# Patient Record
Sex: Female | Born: 1975 | Race: Asian | Hispanic: No | Marital: Married | Smoking: Never smoker
Health system: Southern US, Community
[De-identification: ages and names within clinical notes are randomized; demographics above are authoritative.]

## PROBLEM LIST (undated history)

## (undated) DIAGNOSIS — R112 Nausea with vomiting, unspecified: Secondary | ICD-10-CM

## (undated) DIAGNOSIS — E079 Disorder of thyroid, unspecified: Secondary | ICD-10-CM

## (undated) DIAGNOSIS — Z889 Allergy status to unspecified drugs, medicaments and biological substances status: Secondary | ICD-10-CM

## (undated) DIAGNOSIS — Z9189 Other specified personal risk factors, not elsewhere classified: Secondary | ICD-10-CM

## (undated) DIAGNOSIS — E119 Type 2 diabetes mellitus without complications: Secondary | ICD-10-CM

## (undated) DIAGNOSIS — Z9889 Other specified postprocedural states: Secondary | ICD-10-CM

## (undated) HISTORY — DX: Disorder of thyroid, unspecified: E07.9

## (undated) HISTORY — DX: Allergy status to unspecified drugs, medicaments and biological substances: Z88.9

## (undated) HISTORY — DX: Type 2 diabetes mellitus without complications: E11.9

## (undated) HISTORY — DX: Other specified personal risk factors, not elsewhere classified: Z91.89

---

## 2003-08-02 ENCOUNTER — Other Ambulatory Visit: Admission: RE | Admit: 2003-08-02 | Discharge: 2003-08-02 | Payer: Self-pay | Admitting: Gynecology

## 2004-02-27 ENCOUNTER — Other Ambulatory Visit: Admission: RE | Admit: 2004-02-27 | Discharge: 2004-02-27 | Payer: Self-pay | Admitting: Gynecology

## 2004-08-02 ENCOUNTER — Other Ambulatory Visit: Admission: RE | Admit: 2004-08-02 | Discharge: 2004-08-02 | Payer: Self-pay | Admitting: Gynecology

## 2004-12-27 ENCOUNTER — Other Ambulatory Visit: Admission: RE | Admit: 2004-12-27 | Discharge: 2004-12-27 | Payer: Self-pay | Admitting: Gynecology

## 2005-05-07 ENCOUNTER — Encounter: Admission: RE | Admit: 2005-05-07 | Discharge: 2005-05-07 | Payer: Self-pay | Admitting: Gynecology

## 2005-07-28 ENCOUNTER — Inpatient Hospital Stay (HOSPITAL_COMMUNITY): Admission: AD | Admit: 2005-07-28 | Discharge: 2005-07-31 | Payer: Self-pay | Admitting: Gynecology

## 2005-09-09 ENCOUNTER — Other Ambulatory Visit: Admission: RE | Admit: 2005-09-09 | Discharge: 2005-09-09 | Payer: Self-pay | Admitting: Gynecology

## 2006-09-10 ENCOUNTER — Other Ambulatory Visit: Admission: RE | Admit: 2006-09-10 | Discharge: 2006-09-10 | Payer: Self-pay | Admitting: Gynecology

## 2008-03-04 ENCOUNTER — Inpatient Hospital Stay (HOSPITAL_COMMUNITY): Admission: RE | Admit: 2008-03-04 | Discharge: 2008-03-07 | Payer: Self-pay | Admitting: Obstetrics and Gynecology

## 2008-04-05 ENCOUNTER — Encounter: Admission: RE | Admit: 2008-04-05 | Discharge: 2008-04-08 | Payer: Self-pay | Admitting: Obstetrics and Gynecology

## 2010-05-11 ENCOUNTER — Other Ambulatory Visit: Admission: RE | Admit: 2010-05-11 | Discharge: 2010-05-11 | Payer: Self-pay | Admitting: Family Medicine

## 2010-09-01 ENCOUNTER — Encounter: Payer: Self-pay | Admitting: Family Medicine

## 2010-12-25 NOTE — H&P (Signed)
Diana Andersen, Diana Andersen                ACCOUNT NO.:  [id number]   MEDICAL RECORD NO.:  [id number]          PATIENT TYPE:  INP   LOCATION:  9110                          FACILITY:  [facility]   PHYSICIAN:  Lenoard Aden, M.D.DATE OF BIRTH:  1975-11-20   DATE OF ADMISSION:  03/04/2008  DATE OF DISCHARGE:                              HISTORY & PHYSICAL   CHIEF COMPLAINT:  Primary C-section at 39 weeks.   HISTORY:  She is a 35 year old Asian female G2, P1 at 37 weeks  intrauterine gestation with a history of previous vaginal delivery  complicated by fourth-degree laceration and sever shoulder dystocia for  primary C-section at 39 weeks.  She is apprised of the risks of C-  section to include risks of anesthesia, infection, bleeding, injury to  abdominal organs, and need for repair.  However, she has noted the  possibility of fecal incontinence and fetal injury due to her previous  outcome.  Therefore, she elects to proceed with the primary C-section.   She has no known drug allergies.   MEDICATIONS:  Prenatal vitamins.   She is a nonsmoker, nondrinker.  She denies domestic physical violence.   FAMILY HISTORY:  History of hypertension, diabetes, and Hodgkin  lymphoma.   PHYSICAL EXAMINATION:  GENERAL:  She is a well-developed, well-nourished  Asian female, in no acute distress.  HEENT:  Normal.  LUNGS:  Clear.  HEART:  Regular rate and rhythm.  ABDOMEN:  Soft, gravid, and nontender.  Estimated fetal weight is 7-1/2  pounds.  Cervix is 1 cm thick, vertex -2.  EXTREMITIES:  There are no cords.  NEUROLOGIC:  Nonfocal.  SKIN:  Intact.   IMPRESSION:  1. A 39-week intrauterine pregnancy.  2. Previous shoulder dystocia.  3. History of fourth-degree laceration.   PLAN:  Proceed with primary C-secion.  Risks and benefits discussed.  The patient acknowledges and whishes to proceed.  Risks of anesthesia,  infection, bleeding, injury to abdominal organs, and need for repair  were  discussed.      Lenoard Aden, M.D.  Electronically Signed     RJT/MEDQ  D:  03/04/2008  T:  03/05/2008  Job:  [id number]

## 2010-12-25 NOTE — Op Note (Signed)
[name], [name]                ACCOUNT NO.:  [id number]   MEDICAL RECORD NO.:  [id number]          PATIENT TYPE:  INP   LOCATION:  9110                          FACILITY:  [facility]   PHYSICIAN:  Lenoard Aden, M.D.DATE OF BIRTH:  1976/07/25   DATE OF PROCEDURE:  03/04/2008  DATE OF DISCHARGE:                               OPERATIVE REPORT   PREOPERATIVE DIAGNOSES:  1. 39-week intrauterine pregnancy.  2. Previous fourth-degree laceration.  3. Previous shoulder dystocia.   POSTOPERATIVE DIAGNOSES:  1. 39-week intrauterine pregnancy.  2. Previous fourth-degree laceration.  3. Previous shoulder dystocia.   PROCEDURE:  Primary low segment transverse cesarean section.   SURGEON:  Lenoard Aden, MD   ASSISTANT:  None.   ANESTHESIA:  Spinal.   ESTIMATED BLOOD LOSS:  1000 mL.   COMPLICATIONS:  None.   DRAINS:  Foley.   COUNTS:  Correct.   The patient was taken to recovery room in good condition.   FINDINGS:  Full-term living female, occiput anterior position, Apgars 8  and 9.  Normal tubes, normal ovaries.  Two-layer uterine closure.  Peritoneal closure with a 2-0 plain.   BRIEF OPERATIVE NOTE:  Apprised the risks of anesthesia, infection,  bleeding, injury to abdominal organs, need for repair, delayed versus  complications to include bowel and bladder injury.  The patient was  brought to the operating room.  She was administered a spinal anesthetic  without complications, prepped and draped in usual sterile fashion.  A  Foley catheter was placed after achieving adequate anesthesia.  Dilute  Marcaine solution placed.  Pfannenstiel skin incision made with a  scalpel, carried down to fascia, which was nicked in the midline and  then transversely using Mayo scissors.  Rectus muscles dissected sharply  in the midline.  Peritoneum entered sharply.  Bladder blade placed.  Visceral peritoneum scored sharply off the lower segment.  Kerr  hysterotomy incision made,  atraumatic delivery, full term living female,  7 pounds 8 ounces, handed to pediatricians team, Apgars 8 and 9.  Cord  blood collected.  Placenta delivered manually intact from a posterior  location and sent to labor and delivery.  Cord blood collected.  Uterus  curetted using a dry lap pack and closed in two running imbricating  layers of 0 Monocryl suture.  Interrupted sutures were placed along the  right lateral portion of the incision for good hemostasis.  Bladder flap  was inspected and found be hemostatic.  Irrigation accomplished.  Peritoneum closed in a pursestring fashion using a 2-0 plain.  Rectus  fascia was inspected and then closed using 0 Monocryl in running  fashion.  Skin closed using staples.  The patient tolerated the  procedure well and was transferred to recovery room in good condition.      Lenoard Aden, M.D.  Electronically Signed     RJT/MEDQ  D:  03/04/2008  T:  03/04/2008  Job:  [id number]

## 2010-12-28 NOTE — Discharge Summary (Signed)
NAMENIKKY, DUBA                ACCOUNT NO.:  [id number]   MEDICAL RECORD NO.:  [id number]          PATIENT TYPE:  INP   LOCATION:  9110                          [facility]ILITY:  [facility]   PHYSICIAN:  Lenoard Aden, M.D.DATE OF BIRTH:  [birth year]   DATE OF ADMISSION:  03/04/2008  DATE OF DISCHARGE:  03/07/2008                               DISCHARGE SUMMARY   ADMISSION DIAGNOSES:  A 39-week intrauterine pregnancy, previous severe  shoulder dystocia, previous fourth-degree laceration.   DISCHARGE DIAGNOSES:  A 39-week intrauterine pregnancy, previous severe  shoulder dystocia, previous fourth-degree laceration.   HOSPITAL COURSE:  The patient underwent uncomplicated primary C-section.   POSTOPERATIVE COURSE:  Uncomplicated.   Tolerating regular diet well, ambulating without difficulty.  Discharged  to home on day 3, discharge teaching done.  Tylox, prenatal vitamins,  iron.   Given followup in the office 4-6 weeks.   DISCHARGE CONDITION:  Satisfactory.      Lenoard Aden, M.D.  Electronically Signed     RJT/MEDQ  D:  04/13/2008  T:  04/14/2008  Job:  [id number]

## 2011-05-10 LAB — CBC
MCHC: 34.2
Platelets: 193
Platelets: 246
RBC: 3.53 — ABNORMAL LOW
RDW: 14.4
WBC: 9.4

## 2011-05-10 LAB — RPR: RPR Ser Ql: NONREACTIVE

## 2013-08-17 ENCOUNTER — Other Ambulatory Visit: Payer: Self-pay | Admitting: Obstetrics and Gynecology

## 2013-08-17 DIAGNOSIS — Z1231 Encounter for screening mammogram for malignant neoplasm of breast: Secondary | ICD-10-CM

## 2013-09-03 ENCOUNTER — Ambulatory Visit
Admission: RE | Admit: 2013-09-03 | Discharge: 2013-09-03 | Disposition: A | Discharge status: Home / Self Care | Payer: BC Managed Care – PPO | Source: Ambulatory Visit | Attending: Obstetrics and Gynecology | Admitting: Obstetrics and Gynecology

## 2013-09-03 DIAGNOSIS — Z1231 Encounter for screening mammogram for malignant neoplasm of breast: Secondary | ICD-10-CM

## 2014-04-10 ENCOUNTER — Encounter: Payer: Self-pay | Admitting: *Deleted

## 2014-10-21 LAB — OB RESULTS CONSOLE HEPATITIS B SURFACE ANTIGEN: Hepatitis B Surface Ag: NEGATIVE

## 2014-10-21 LAB — OB RESULTS CONSOLE ABO/RH: RH TYPE: POSITIVE

## 2014-10-21 LAB — OB RESULTS CONSOLE HIV ANTIBODY (ROUTINE TESTING): HIV: NONREACTIVE

## 2014-10-21 LAB — OB RESULTS CONSOLE RUBELLA ANTIBODY, IGM: RUBELLA: IMMUNE

## 2014-11-02 LAB — OB RESULTS CONSOLE GC/CHLAMYDIA
Chlamydia: NEGATIVE
Gonorrhea: NEGATIVE

## 2015-02-21 LAB — OB RESULTS CONSOLE RPR: RPR: NONREACTIVE

## 2015-03-08 ENCOUNTER — Encounter: Payer: BLUE CROSS/BLUE SHIELD | Attending: Obstetrics and Gynecology

## 2015-03-08 VITALS — Ht 63.0 in | Wt 140.2 lb

## 2015-03-08 DIAGNOSIS — O24419 Gestational diabetes mellitus in pregnancy, unspecified control: Secondary | ICD-10-CM | POA: Diagnosis present

## 2015-03-08 DIAGNOSIS — Z713 Dietary counseling and surveillance: Secondary | ICD-10-CM | POA: Insufficient documentation

## 2015-03-08 DIAGNOSIS — Z3A00 Weeks of gestation of pregnancy not specified: Secondary | ICD-10-CM | POA: Insufficient documentation

## 2015-03-08 DIAGNOSIS — Z6824 Body mass index (BMI) 24.0-24.9, adult: Secondary | ICD-10-CM | POA: Diagnosis not present

## 2015-03-09 NOTE — Progress Notes (Signed)
  Patient was seen on 03/08/15 for Gestational Diabetes self-management . The following learning objectives were met by the patient :   States the definition of Gestational Diabetes  States why dietary management is important in controlling blood glucose  Describes the effects of carbohydrates on blood glucose levels  Demonstrates ability to create a balanced meal plan  Demonstrates carbohydrate counting   States when to check blood glucose levels  Demonstrates proper blood glucose monitoring techniques  States the effect of stress and exercise on blood glucose levels  States the importance of limiting caffeine and abstaining from alcohol and smoking  Plan:  Aim for 2 Carb Choices per meal (30 grams) +/- 1 either way for breakfast Aim for 3 Carb Choices per meal (45 grams) +/- 1 either way from lunch and dinner Aim for 1-2 Carbs per snack Begin reading food labels for Total Carbohydrate and sugar grams of foods Consider  increasing your activity level by walking daily as tolerated Begin checking BG before breakfast and 1 hours after first bit of breakfast, lunch and dinner after  as directed by MD  Take medication  as directed by MD  Blood glucose monitor given: One Touch Verio Flex  Lot # [id number] X Exp: 04/2016 Blood glucose reading: 15m/dl  Patient instructed to monitor glucose levels: FBS: 60 - <90 1 hour: <140 2 hour: <120  Patient received the following handouts:  Nutrition Diabetes and Pregnancy  Carbohydrate Counting List  Meal Planning worksheet  Patient will be seen for follow-up as needed.

## 2015-04-21 LAB — OB RESULTS CONSOLE GBS: GBS: NEGATIVE

## 2015-04-26 ENCOUNTER — Other Ambulatory Visit: Payer: Self-pay | Admitting: Obstetrics and Gynecology

## 2015-05-04 ENCOUNTER — Other Ambulatory Visit: Payer: Self-pay | Admitting: Obstetrics and Gynecology

## 2015-05-16 ENCOUNTER — Encounter (HOSPITAL_COMMUNITY): Payer: Self-pay

## 2015-05-16 NOTE — Patient Instructions (Addendum)
Your procedure is scheduled on:  THURSDAY, May 18, 2015  Enter through the Main Entrance of [facility] at: 8:00 A.M.  Pick up the phone at the desk and dial [phone].  Call this number if you have problems the morning of surgery: [phone].  Remember: Do NOT eat food or drink after: Midnight tonight Take these medicines the morning of surgery with a SIP OF WATER: NONE  Do NOT wear jewelry (body piercing), metal hair clips/bobby pins, or nail polish. Do NOT wear lotions, powders, or perfumes.  You may wear deoderant. Do NOT shave for 48 hours prior to surgery. Do NOT bring valuables to the hospital. Leave suitcase in car.  After surgery it may be brought to your room.  For patients admitted to the hospital, checkout time is 11:00 AM the day of discharge.  Bring in living will/healthcare power of attorney day of surgery

## 2015-05-17 ENCOUNTER — Encounter (HOSPITAL_COMMUNITY): Payer: Self-pay

## 2015-05-17 ENCOUNTER — Encounter (HOSPITAL_COMMUNITY)
Admission: RE | Admit: 2015-05-17 | Discharge: 2015-05-17 | Disposition: A | Discharge status: Home / Self Care | Payer: BLUE CROSS/BLUE SHIELD | Source: Ambulatory Visit | Attending: Obstetrics and Gynecology | Admitting: Obstetrics and Gynecology

## 2015-05-17 HISTORY — DX: Nausea with vomiting, unspecified: R11.2

## 2015-05-17 HISTORY — DX: Other specified postprocedural states: Z98.890

## 2015-05-17 LAB — CBC
HCT: 35.3 % — ABNORMAL LOW (ref 36.0–46.0)
Hemoglobin: 12.1 g/dL (ref 12.0–15.0)
MCH: 34.1 pg — ABNORMAL HIGH (ref 26.0–34.0)
MCHC: 34.3 g/dL (ref 30.0–36.0)
MCV: 99.4 fL (ref 78.0–100.0)
PLATELETS: 187 10*3/uL (ref 150–400)
RBC: 3.55 MIL/uL — AB (ref 3.87–5.11)
RDW: 14.1 % (ref 11.5–15.5)
WBC: 6.7 10*3/uL (ref 4.0–10.5)

## 2015-05-17 LAB — TYPE AND SCREEN
ABO/RH(D): O POS
ANTIBODY SCREEN: NEGATIVE

## 2015-05-17 LAB — BASIC METABOLIC PANEL
Anion gap: 5 (ref 5–15)
BUN: 10 mg/dL (ref 6–20)
CHLORIDE: 108 mmol/L (ref 101–111)
CO2: 23 mmol/L (ref 22–32)
CREATININE: 0.52 mg/dL (ref 0.44–1.00)
Calcium: 8.7 mg/dL — ABNORMAL LOW (ref 8.9–10.3)
Glucose, Bld: 84 mg/dL (ref 65–99)
POTASSIUM: 3.6 mmol/L (ref 3.5–5.1)
SODIUM: 136 mmol/L (ref 135–145)

## 2015-05-17 LAB — ABO/RH: ABO/RH(D): O POS

## 2015-05-17 NOTE — H&P (Signed)
Diana Andersen is a 39 y.o. female presenting for rpt csection. Maternal Medical History:  Contractions: Onset was less than 1 hour ago.   Frequency: rare.   Perceived severity is mild.    Fetal activity: Perceived fetal activity is normal.   Last perceived fetal movement was within the past hour.    Prenatal complications: no prenatal complications Prenatal Complications - Diabetes: gestational.    OB History    Gravida Para Term Preterm AB TAB SAB Ectopic Multiple Living   3 2 2       2      Past Medical History  Diagnosis Date  . H/O multiple allergies   . Diabetes mellitus without complication ([place])     gestational  . PONV (postoperative nausea and vomiting)    Past Surgical History  Procedure Laterality Date  . Cesarean section    . Vaginal delivery     Family History: Family history is unknown by patient. Social History:  reports that she has never smoked. She has never used smokeless tobacco. She reports that she drinks alcohol. She reports that she does not use illicit drugs.   Prenatal Transfer Tool  Maternal Diabetes: Yes:  Diabetes Type:  Diet controlled Genetic Screening: Normal Maternal Ultrasounds/Referrals: Normal Fetal Ultrasounds or other Referrals:  None Maternal Substance Abuse:  No Significant Maternal Medications:  None Significant Maternal Lab Results:  None Other Comments:  None  Review of Systems  Constitutional: Negative.   All other systems reviewed and are negative.     Last menstrual period 08/19/2014. Maternal Exam:  Uterine Assessment: Contraction strength is mild.  Contraction frequency is rare.   Abdomen: Patient reports no abdominal tenderness. Surgical scars: low transverse.   Fetal presentation: vertex  Introitus: Normal vulva. Normal vagina.  Ferning test: not done.  Nitrazine test: not done. Amniotic fluid character: not assessed.  Pelvis: adequate for delivery.   Cervix: Cervix evaluated by digital exam.      Physical Exam  Nursing note and vitals reviewed. Constitutional: She is oriented to person, place, and time. She appears well-developed and well-nourished.  HENT:  Head: Normocephalic and atraumatic.  Neck: Normal range of motion. Neck supple.  Cardiovascular: Normal rate and regular rhythm.   Respiratory: Effort normal and breath sounds normal.  GI: Soft. Bowel sounds are normal.  Genitourinary: Vagina normal and uterus normal.  Musculoskeletal: Normal range of motion.  Neurological: She is alert and oriented to person, place, and time. She has normal reflexes.  Skin: Skin is warm and dry.  Psychiatric: She has a normal mood and affect.    Prenatal labs: ABO, Rh: --/--/O POS, O POS (10/05 0920) Antibody: NEG (10/05 0920) Rubella: Immune (03/11 0000) RPR: Nonreactive (07/12 0000)  HBsAg: Negative (03/11 0000)  HIV: Non-reactive (03/11 0000)  GBS: Negative (09/09 0000)   Assessment/Plan: 39 weeks. Previous csection for rpt Consent done GDM    Sherrill Buikema J 05/17/2015, 9:29 PM    For

## 2015-05-17 NOTE — Anesthesia Preprocedure Evaluation (Addendum)
Anesthesia Evaluation  Patient identified by MRN, date of birth, ID band Patient awake    Reviewed: Allergy & Precautions, H&P , NPO status , Patient's Chart, lab work & pertinent test results  History of Anesthesia Complications (+) PONV and history of anesthetic complications  Airway Mallampati: I  TM Distance: >3 FB Neck ROM: full    Dental no notable dental hx.    Pulmonary neg pulmonary ROS,    Pulmonary exam normal breath sounds clear to auscultation       Cardiovascular negative cardio ROS Normal cardiovascular exam Rhythm:regular Rate:Normal     Neuro/Psych negative neurological ROS  negative psych ROS   GI/Hepatic negative GI ROS, Neg liver ROS,   Endo/Other  negative endocrine ROSdiabetes  Renal/GU negative Renal ROS  negative genitourinary   Musculoskeletal   Abdominal   Peds  Hematology negative hematology ROS (+)   Anesthesia Other Findings   Reproductive/Obstetrics (+) Pregnancy                            Anesthesia Physical Anesthesia Plan  ASA: II  Anesthesia Plan: Spinal   Post-op Pain Management:    Induction:   Airway Management Planned:   Additional Equipment:   Intra-op Plan:   Post-operative Plan:   Informed Consent: I have reviewed the patients History and Physical, chart, labs and discussed the procedure including the risks, benefits and alternatives for the proposed anesthesia with the patient or authorized representative who has indicated his/her understanding and acceptance.   Dental advisory given  Plan Discussed with: Anesthesiologist and CRNA  Anesthesia Plan Comments:         Anesthesia Quick Evaluation

## 2015-05-18 ENCOUNTER — Encounter (HOSPITAL_COMMUNITY): Payer: Self-pay

## 2015-05-18 ENCOUNTER — Encounter (HOSPITAL_COMMUNITY)
Admission: AD | Disposition: A | Discharge status: Home / Self Care | Payer: Self-pay | Source: Ambulatory Visit | Attending: Obstetrics and Gynecology

## 2015-05-18 ENCOUNTER — Inpatient Hospital Stay (HOSPITAL_COMMUNITY): Payer: BLUE CROSS/BLUE SHIELD | Admitting: Anesthesiology

## 2015-05-18 ENCOUNTER — Inpatient Hospital Stay (HOSPITAL_COMMUNITY)
Admission: AD | Admit: 2015-05-18 | Discharge: 2015-05-20 | DRG: 765 | Disposition: A | Discharge status: Home / Self Care | Payer: BLUE CROSS/BLUE SHIELD | Source: Ambulatory Visit | Attending: Obstetrics and Gynecology | Admitting: Obstetrics and Gynecology

## 2015-05-18 DIAGNOSIS — Z302 Encounter for sterilization: Secondary | ICD-10-CM

## 2015-05-18 DIAGNOSIS — D62 Acute posthemorrhagic anemia: Secondary | ICD-10-CM | POA: Diagnosis not present

## 2015-05-18 DIAGNOSIS — O24429 Gestational diabetes mellitus in childbirth, unspecified control: Secondary | ICD-10-CM | POA: Diagnosis present

## 2015-05-18 DIAGNOSIS — O34211 Maternal care for low transverse scar from previous cesarean delivery: Principal | ICD-10-CM | POA: Diagnosis present

## 2015-05-18 DIAGNOSIS — Z3A39 39 weeks gestation of pregnancy: Secondary | ICD-10-CM | POA: Diagnosis not present

## 2015-05-18 LAB — RPR: RPR Ser Ql: NONREACTIVE

## 2015-05-18 SURGERY — Surgical Case
Anesthesia: Spinal | Laterality: Bilateral

## 2015-05-18 MED ORDER — LACTATED RINGERS IV SOLN
INTRAVENOUS | Status: DC
  Administered 2015-05-18: 125 mL/h via INTRAVENOUS
  Administered 2015-05-19: 02:00:00 via INTRAVENOUS

## 2015-05-18 MED ORDER — FENTANYL CITRATE (PF) 100 MCG/2ML IJ SOLN
INTRAMUSCULAR | Status: AC
  Filled 2015-05-18: qty 2

## 2015-05-18 MED ORDER — BUPIVACAINE IN DEXTROSE 0.75-8.25 % IT SOLN
INTRATHECAL | Status: DC | PRN
  Administered 2015-05-18: 1.6 mL via INTRATHECAL

## 2015-05-18 MED ORDER — FENTANYL CITRATE (PF) 100 MCG/2ML IJ SOLN
25.0000 ug | INTRAMUSCULAR | Status: DC | PRN
  Administered 2015-05-18: 50 ug via INTRAVENOUS

## 2015-05-18 MED ORDER — NALBUPHINE HCL 10 MG/ML IJ SOLN: 5.0000 mg | Freq: Once | INTRAMUSCULAR | Status: DC | PRN

## 2015-05-18 MED ORDER — SIMETHICONE 80 MG PO CHEW: 80.0000 mg | CHEWABLE_TABLET | ORAL | Status: DC | PRN

## 2015-05-18 MED ORDER — NALBUPHINE HCL 10 MG/ML IJ SOLN: 5.0000 mg | INTRAMUSCULAR | Status: DC | PRN

## 2015-05-18 MED ORDER — OXYCODONE-ACETAMINOPHEN 5-325 MG PO TABS
1.0000 | ORAL_TABLET | ORAL | Status: DC | PRN
  Administered 2015-05-18 – 2015-05-19 (×2): 1 via ORAL
  Filled 2015-05-18 (×2): qty 1

## 2015-05-18 MED ORDER — LACTATED RINGERS IV SOLN
INTRAVENOUS | Status: DC
  Administered 2015-05-18 (×4): via INTRAVENOUS

## 2015-05-18 MED ORDER — FENTANYL CITRATE (PF) 100 MCG/2ML IJ SOLN
INTRAMUSCULAR | Status: AC
  Filled 2015-05-18: qty 4

## 2015-05-18 MED ORDER — KETOROLAC TROMETHAMINE 30 MG/ML IJ SOLN: 30.0000 mg | Freq: Four times a day (QID) | INTRAMUSCULAR | Status: AC | PRN

## 2015-05-18 MED ORDER — MEPERIDINE HCL 25 MG/ML IJ SOLN
INTRAMUSCULAR | Status: AC
  Filled 2015-05-18: qty 1

## 2015-05-18 MED ORDER — DIPHENHYDRAMINE HCL 50 MG/ML IJ SOLN: 12.5000 mg | INTRAMUSCULAR | Status: DC | PRN

## 2015-05-18 MED ORDER — LANOLIN HYDROUS EX OINT: 1.0000 "application " | TOPICAL_OINTMENT | CUTANEOUS | Status: DC | PRN

## 2015-05-18 MED ORDER — ONDANSETRON HCL 4 MG/2ML IJ SOLN: 4.0000 mg | Freq: Three times a day (TID) | INTRAMUSCULAR | Status: DC | PRN

## 2015-05-18 MED ORDER — MEPERIDINE HCL 25 MG/ML IJ SOLN
INTRAMUSCULAR | Status: DC | PRN
  Administered 2015-05-18: 12.5 mg via INTRAVENOUS

## 2015-05-18 MED ORDER — BUPIVACAINE LIPOSOME 1.3 % IJ SUSP
20.0000 mL | Freq: Once | INTRAMUSCULAR | Status: DC
  Filled 2015-05-18: qty 20

## 2015-05-18 MED ORDER — BUPIVACAINE HCL (PF) 0.25 % IJ SOLN
INTRAMUSCULAR | Status: DC | PRN
  Administered 2015-05-18: 10 mL

## 2015-05-18 MED ORDER — SCOPOLAMINE 1 MG/3DAYS TD PT72
1.0000 | MEDICATED_PATCH | Freq: Once | TRANSDERMAL | Status: DC
  Filled 2015-05-18: qty 1

## 2015-05-18 MED ORDER — CEFAZOLIN SODIUM-DEXTROSE 2-3 GM-% IV SOLR
INTRAVENOUS | Status: AC
  Filled 2015-05-18: qty 50

## 2015-05-18 MED ORDER — ONDANSETRON HCL 4 MG/2ML IJ SOLN
INTRAMUSCULAR | Status: DC | PRN
  Administered 2015-05-18: 4 mg via INTRAVENOUS

## 2015-05-18 MED ORDER — ONDANSETRON HCL 4 MG/2ML IJ SOLN
INTRAMUSCULAR | Status: AC
  Filled 2015-05-18: qty 2

## 2015-05-18 MED ORDER — BUPIVACAINE HCL (PF) 0.25 % IJ SOLN
INTRAMUSCULAR | Status: AC
  Filled 2015-05-18: qty 10

## 2015-05-18 MED ORDER — DIPHENHYDRAMINE HCL 25 MG PO CAPS: 25.0000 mg | ORAL_CAPSULE | Freq: Four times a day (QID) | ORAL | Status: DC | PRN

## 2015-05-18 MED ORDER — NALOXONE HCL 1 MG/ML IJ SOLN: 1.0000 ug/kg/h | INTRAVENOUS | Status: DC | PRN

## 2015-05-18 MED ORDER — OXYTOCIN 40 UNITS IN LACTATED RINGERS INFUSION - SIMPLE MED: 62.5000 mL/h | INTRAVENOUS | Status: AC

## 2015-05-18 MED ORDER — SIMETHICONE 80 MG PO CHEW
80.0000 mg | CHEWABLE_TABLET | Freq: Three times a day (TID) | ORAL | Status: DC
  Administered 2015-05-18 – 2015-05-20 (×5): 80 mg via ORAL
  Filled 2015-05-18 (×4): qty 1

## 2015-05-18 MED ORDER — LACTATED RINGERS IV SOLN
INTRAVENOUS | Status: DC | PRN
  Administered 2015-05-18: 09:00:00 via INTRAVENOUS

## 2015-05-18 MED ORDER — OXYCODONE-ACETAMINOPHEN 5-325 MG PO TABS
2.0000 | ORAL_TABLET | ORAL | Status: DC | PRN
  Administered 2015-05-19 (×2): 2 via ORAL
  Filled 2015-05-18 (×2): qty 2

## 2015-05-18 MED ORDER — ZOLPIDEM TARTRATE 5 MG PO TABS: 5.0000 mg | ORAL_TABLET | Freq: Every evening | ORAL | Status: DC | PRN

## 2015-05-18 MED ORDER — DEXAMETHASONE SODIUM PHOSPHATE 4 MG/ML IJ SOLN
INTRAMUSCULAR | Status: AC
  Filled 2015-05-18: qty 1

## 2015-05-18 MED ORDER — MEPERIDINE HCL 25 MG/ML IJ SOLN: 6.2500 mg | INTRAMUSCULAR | Status: DC | PRN

## 2015-05-18 MED ORDER — SIMETHICONE 80 MG PO CHEW
80.0000 mg | CHEWABLE_TABLET | ORAL | Status: DC
Start: 2015-05-19 — End: 2015-05-20
  Administered 2015-05-18 – 2015-05-20 (×2): 80 mg via ORAL
  Filled 2015-05-18 (×2): qty 1

## 2015-05-18 MED ORDER — MORPHINE SULFATE (PF) 0.5 MG/ML IJ SOLN
INTRAMUSCULAR | Status: AC
  Filled 2015-05-18: qty 100

## 2015-05-18 MED ORDER — FENTANYL CITRATE (PF) 100 MCG/2ML IJ SOLN
INTRAMUSCULAR | Status: DC | PRN
  Administered 2015-05-18: 10 ug via INTRATHECAL

## 2015-05-18 MED ORDER — SENNOSIDES-DOCUSATE SODIUM 8.6-50 MG PO TABS
2.0000 | ORAL_TABLET | ORAL | Status: DC
  Administered 2015-05-18 – 2015-05-20 (×2): 2 via ORAL
  Filled 2015-05-18 (×2): qty 2

## 2015-05-18 MED ORDER — WITCH HAZEL-GLYCERIN EX PADS: 1.0000 "application " | MEDICATED_PAD | CUTANEOUS | Status: DC | PRN

## 2015-05-18 MED ORDER — CEFAZOLIN SODIUM-DEXTROSE 2-3 GM-% IV SOLR
2.0000 g | INTRAVENOUS | Status: AC
  Administered 2015-05-18: 2 g via INTRAVENOUS

## 2015-05-18 MED ORDER — MENTHOL 3 MG MT LOZG
1.0000 | LOZENGE | OROMUCOSAL | Status: DC | PRN
Start: 2015-05-18 — End: 2015-05-20

## 2015-05-18 MED ORDER — PHENYLEPHRINE 8 MG IN D5W 100 ML (0.08MG/ML) PREMIX OPTIME
INJECTION | INTRAVENOUS | Status: AC
  Filled 2015-05-18: qty 100

## 2015-05-18 MED ORDER — METHYLERGONOVINE MALEATE 0.2 MG/ML IJ SOLN: 0.2000 mg | INTRAMUSCULAR | Status: DC | PRN

## 2015-05-18 MED ORDER — OXYTOCIN 10 UNIT/ML IJ SOLN
INTRAMUSCULAR | Status: AC
  Filled 2015-05-18: qty 4

## 2015-05-18 MED ORDER — TETANUS-DIPHTH-ACELL PERTUSSIS 5-2.5-18.5 LF-MCG/0.5 IM SUSP: 0.5000 mL | Freq: Once | INTRAMUSCULAR | Status: DC

## 2015-05-18 MED ORDER — METHYLERGONOVINE MALEATE 0.2 MG PO TABS: 0.2000 mg | ORAL_TABLET | ORAL | Status: DC | PRN

## 2015-05-18 MED ORDER — IBUPROFEN 600 MG PO TABS
600.0000 mg | ORAL_TABLET | Freq: Four times a day (QID) | ORAL | Status: DC
  Administered 2015-05-18 – 2015-05-20 (×9): 600 mg via ORAL
  Filled 2015-05-18 (×9): qty 1

## 2015-05-18 MED ORDER — SCOPOLAMINE 1 MG/3DAYS TD PT72
1.0000 | MEDICATED_PATCH | Freq: Once | TRANSDERMAL | Status: DC
  Administered 2015-05-18: 1.5 mg via TRANSDERMAL

## 2015-05-18 MED ORDER — PHENYLEPHRINE 8 MG IN D5W 100 ML (0.08MG/ML) PREMIX OPTIME
INJECTION | INTRAVENOUS | Status: DC | PRN
  Administered 2015-05-18: 60 ug/min via INTRAVENOUS

## 2015-05-18 MED ORDER — MORPHINE SULFATE (PF) 0.5 MG/ML IJ SOLN
INTRAMUSCULAR | Status: DC | PRN
  Administered 2015-05-18: .2 mg via INTRATHECAL

## 2015-05-18 MED ORDER — ACETAMINOPHEN 500 MG PO TABS
1000.0000 mg | ORAL_TABLET | Freq: Four times a day (QID) | ORAL | Status: AC
  Filled 2015-05-18 (×2): qty 2

## 2015-05-18 MED ORDER — NALOXONE HCL 0.4 MG/ML IJ SOLN: 0.4000 mg | INTRAMUSCULAR | Status: DC | PRN

## 2015-05-18 MED ORDER — PRENATAL MULTIVITAMIN CH
1.0000 | ORAL_TABLET | Freq: Every day | ORAL | Status: DC
  Administered 2015-05-19 – 2015-05-20 (×2): 1 via ORAL
  Filled 2015-05-18 (×2): qty 1

## 2015-05-18 MED ORDER — SODIUM CHLORIDE 0.9 % IJ SOLN: 3.0000 mL | INTRAMUSCULAR | Status: DC | PRN

## 2015-05-18 MED ORDER — BUPIVACAINE LIPOSOME 1.3 % IJ SUSP
INTRAMUSCULAR | Status: DC | PRN
  Administered 2015-05-18: 20 mL

## 2015-05-18 MED ORDER — SCOPOLAMINE 1 MG/3DAYS TD PT72
MEDICATED_PATCH | TRANSDERMAL | Status: AC
  Administered 2015-05-18: 1.5 mg via TRANSDERMAL
  Filled 2015-05-18: qty 1

## 2015-05-18 MED ORDER — DEXAMETHASONE SODIUM PHOSPHATE 4 MG/ML IJ SOLN
INTRAMUSCULAR | Status: DC | PRN
  Administered 2015-05-18: 4 mg via INTRAVENOUS

## 2015-05-18 MED ORDER — PROMETHAZINE HCL 25 MG/ML IJ SOLN: 6.2500 mg | INTRAMUSCULAR | Status: DC | PRN

## 2015-05-18 MED ORDER — OXYTOCIN 10 UNIT/ML IJ SOLN
40.0000 [IU] | INTRAVENOUS | Status: DC | PRN
  Administered 2015-05-18: 40 [IU] via INTRAVENOUS

## 2015-05-18 MED ORDER — ACETAMINOPHEN 325 MG PO TABS: 650.0000 mg | ORAL_TABLET | ORAL | Status: DC | PRN

## 2015-05-18 MED ORDER — DIBUCAINE 1 % RE OINT: 1.0000 "application " | TOPICAL_OINTMENT | RECTAL | Status: DC | PRN

## 2015-05-18 MED ORDER — DIPHENHYDRAMINE HCL 25 MG PO CAPS
25.0000 mg | ORAL_CAPSULE | ORAL | Status: DC | PRN
  Administered 2015-05-19: 25 mg via ORAL
  Filled 2015-05-18: qty 1

## 2015-05-18 SURGICAL SUPPLY — 40 items
APL SKNCLS STERI-STRIP NONHPOA (GAUZE/BANDAGES/DRESSINGS) ×1
BENZOIN TINCTURE PRP APPL 2/3 (GAUZE/BANDAGES/DRESSINGS) ×2 IMPLANT
CLAMP CORD UMBIL (MISCELLANEOUS) IMPLANT
CLOSURE WOUND 1/2 X4 (GAUZE/BANDAGES/DRESSINGS) ×1
CLOTH BEACON ORANGE TIMEOUT ST (SAFETY) ×3 IMPLANT
CONTAINER PREFILL 10% NBF 15ML (MISCELLANEOUS) IMPLANT
DECANTER SPIKE VIAL GLASS SM (MISCELLANEOUS) ×2 IMPLANT
DRAPE SHEET LG 3/4 BI-LAMINATE (DRAPES) IMPLANT
DRSG OPSITE POSTOP 4X10 (GAUZE/BANDAGES/DRESSINGS) ×3 IMPLANT
DURAPREP 26ML APPLICATOR (WOUND CARE) ×3 IMPLANT
ELECT REM PT RETURN 9FT ADLT (ELECTROSURGICAL) ×3
ELECTRODE REM PT RTRN 9FT ADLT (ELECTROSURGICAL) ×1 IMPLANT
EXTRACTOR VACUUM M CUP 4 TUBE (SUCTIONS) IMPLANT
EXTRACTOR VACUUM M CUP 4' TUBE (SUCTIONS)
GLOVE BIO SURGEON STRL SZ7.5 (GLOVE) ×3 IMPLANT
GOWN STRL REUS W/TWL LRG LVL3 (GOWN DISPOSABLE) ×6 IMPLANT
KIT ABG SYR 3ML LUER SLIP (SYRINGE) IMPLANT
NDL HYPO 25X5/8 SAFETYGLIDE (NEEDLE) IMPLANT
NDL SPNL 20GX3.5 QUINCKE YW (NEEDLE) IMPLANT
NEEDLE HYPO 22GX1.5 SAFETY (NEEDLE) ×3 IMPLANT
NEEDLE HYPO 25X5/8 SAFETYGLIDE (NEEDLE) IMPLANT
NEEDLE SPNL 20GX3.5 QUINCKE YW (NEEDLE) IMPLANT
NS IRRIG 1000ML POUR BTL (IV SOLUTION) ×3 IMPLANT
PACK C SECTION WH (CUSTOM PROCEDURE TRAY) ×3 IMPLANT
PAD OB MATERNITY 4.3X12.25 (PERSONAL CARE ITEMS) ×3 IMPLANT
PENCIL SMOKE EVAC W/HOLSTER (ELECTROSURGICAL) ×3 IMPLANT
STRIP CLOSURE SKIN 1/2X4 (GAUZE/BANDAGES/DRESSINGS) ×1 IMPLANT
SUT MNCRL 0 VIOLET CTX 36 (SUTURE) ×2 IMPLANT
SUT MNCRL AB 3-0 PS2 27 (SUTURE) IMPLANT
SUT MON AB 2-0 CT1 27 (SUTURE) ×3 IMPLANT
SUT MON AB-0 CT1 36 (SUTURE) ×6 IMPLANT
SUT MONOCRYL 0 CTX 36 (SUTURE) ×4
SUT PLAIN 0 NONE (SUTURE) IMPLANT
SUT PLAIN 2 0 (SUTURE)
SUT PLAIN 2 0 XLH (SUTURE) IMPLANT
SUT PLAIN ABS 2-0 CT1 27XMFL (SUTURE) IMPLANT
SYR 20CC LL (SYRINGE) IMPLANT
SYR CONTROL 10ML LL (SYRINGE) ×3 IMPLANT
TOWEL OR 17X24 6PK STRL BLUE (TOWEL DISPOSABLE) ×3 IMPLANT
TRAY FOLEY CATH SILVER 14FR (SET/KITS/TRAYS/PACK) ×3 IMPLANT

## 2015-05-18 NOTE — Anesthesia Procedure Notes (Signed)
Spinal Patient location during procedure: OR Staffing Anesthesiologist: Adaeze Better Performed by: anesthesiologist  Preanesthetic Checklist Completed: patient identified, site marked, surgical consent, pre-op evaluation, timeout performed, IV checked, risks and benefits discussed and monitors and equipment checked Spinal Block Patient position: sitting Prep: DuraPrep Patient monitoring: heart rate, continuous pulse ox and blood pressure Location: L3-4 Injection technique: single-shot Needle Needle type: Pencan  Needle gauge: 24 G Needle length: 9 cm Assessment Sensory level: T6 Additional Notes Expiration date of kit checked and confirmed. Patient tolerated procedure well, without complications.

## 2015-05-18 NOTE — Anesthesia Postprocedure Evaluation (Signed)
  Anesthesia Post-op Note  Patient: Diana Andersen  Procedure(s) Performed: Procedure(s) (LRB): Repeat CESAREAN SECTION WITH BILATERAL TUBAL LIGATION (Bilateral)  Patient Location: PACU  Anesthesia Type: Spinal  Level of Consciousness: awake and alert   Airway and Oxygen Therapy: Patient Spontanous Breathing  Post-op Pain: mild  Post-op Assessment: Post-op Vital signs reviewed, Patient's Cardiovascular Status Stable, Respiratory Function Stable, Patent Airway and No signs of Nausea or vomiting  Last Vitals:  Filed Vitals:   05/18/15 1000  BP: 128/58  Pulse: 78  Temp: 37.2 C  Resp: 20    Post-op Vital Signs: stable   Complications: No apparent anesthesia complications

## 2015-05-18 NOTE — Anesthesia Postprocedure Evaluation (Signed)
  Anesthesia Post-op Note  Patient: Diana Andersen  Procedure(s) Performed: Procedure(s) with comments: Repeat CESAREAN SECTION WITH BILATERAL TUBAL LIGATION (Bilateral) - EDD: 05/20/15 Gaylord Shih RNFA confirmed on 05/15/15  Patient Location: Mother/Baby  Anesthesia Type:Spinal  Level of Consciousness: awake, alert , oriented and patient cooperative  Airway and Oxygen Therapy: Patient Spontanous Breathing  Post-op Pain: none  Post-op Assessment: Post-op Vital signs reviewed, Patient's Cardiovascular Status Stable, Respiratory Function Stable, Patent Airway, No headache, No backache and Patient able to bend at knees  Post-op Vital Signs: Reviewed and stable  Last Vitals:  Filed Vitals:   05/18/15 1300  BP: 120/63  Pulse: 61  Temp: 37.1 C  Resp: 18    Complications: No apparent anesthesia complications

## 2015-05-18 NOTE — Progress Notes (Signed)
Patient ID: Diana Andersen, female   DOB: 1976/07/02, 39 y.o.   MRN: [id number] Patient seen and examined. Consent witnessed and signed. No changes noted. Update completed.

## 2015-05-18 NOTE — Op Note (Signed)
Cesarean Section Procedure Note with Tubal Ligation  Indications: previous uterine incision kerr x2  Elective Sterilization  Pre-operative Diagnosis: 39 week 5 day pregnancy.  Post-operative Diagnosis: same  Surgeon: Lovenia Kim   Assistants: none  Anesthesia: Local anesthesia 0.25.% bupivacaine and Spinal anesthesia  ASA Class: 2  Procedure Details  The patient was seen in the Holding Room. The risks, benefits, complications, treatment options, and expected outcomes were discussed with the patient.  The patient concurred with the proposed plan, giving informed consent. The risks of anesthesia, infection, bleeding and possible injury to other organs discussed. Injury to bowel, bladder, or ureter with possible need for repair discussed. Possible need for transfusion with secondary risks of hepatitis or HIV acquisition discussed. Post operative complications to include but not limited to DVT, PE and Pneumonia noted. The site of surgery properly noted/marked. The patient was taken to Operating Room # 9, identified as Diana Andersen and the procedure verified as C-Section Delivery. A Time Out was held and the above information confirmed.  After induction of anesthesia, the patient was draped and prepped in the usual sterile manner. A Pfannenstiel incision was made and carried down through the subcutaneous tissue to the fascia. Fascial incision was made and extended transversely using Mayo scissors. The fascia was separated from the underlying rectus tissue superiorly and inferiorly. The peritoneum was identified and entered. Peritoneal incision was extended longitudinally. The utero-vesical peritoneal reflection was incised transversely and the bladder flap was bluntly freed from the lower uterine segment. A low transverse uterine incision(Kerr hysterotomy) was made through a thin LUS. Delivered from OA presentation was a  female with Apgar scores of 9 at one minute and 9 at five minutes. Bulb  suctioning gently performed. Neonatal team in attendance.After the umbilical cord was clamped and cut cord blood was obtained for evaluation. The placenta was removed intact and appeared normal. The uterus was curetted with a dry lap pack. Good hemostasis was noted.The uterine outline, tubes and ovaries appeared normal. The uterine incision was closed with running locked sutures of 0 Monocryl x 2 layers. Hemostasis was observed. Modified Pomeroy tubal ligation done bilaterally to interrupt both tubes. The parietal peritoneum was closed with a running 2-0 Monocryl suture. The fascia was then reapproximated with running sutures of 0 Monocryl. The skin was reapproximated with 3-0 monocryl after Adams closure with 2-0  plain.  Instrument, sponge, and needle counts were correct prior the abdominal closure and at the conclusion of the case.   Findings: Thin LUS, nl tubes. Nl ovaries  Estimated Blood Loss:  300 mL         Drains: foley                 Specimens: placenta and tubal segments                 Complications:  None; patient tolerated the procedure well.         Disposition: PACU - hemodynamically stable.         Condition: stable  Attending Attestation: I performed the procedure.

## 2015-05-18 NOTE — Lactation Note (Signed)
This note was copied from the chart of Diana Tiffanee Mcnee. Lactation Consultation Note  Patient Name: Diana Andersen OEUMP'N Date: 05/18/2015 Reason for consult: Initial assessment Experienced bf mom. She stated that baby is latching well but has been crying a lot so dad wanted to give the baby formula. Went over belly size, milk transition, breast changes, voids, wt loss, and nipple care. She stated that she bf her other 2 children for a couple of months each until she went back to work and pumping got to be too much. She has plans to get a DEBP from her insurance company. Mom is aware of support group and O/P lactation.   Maternal Data Has patient been taught Hand Expression?: Yes Does the patient have breastfeeding experience prior to this delivery?: Yes  Feeding    LATCH Score/Interventions                      Lactation Tools Discussed/Used WIC Program: No   Consult Status Consult Status: Follow-up Date: 05/19/15 Follow-up type: In-patient    Denzil Hughes 05/18/2015, 5:57 PM

## 2015-05-18 NOTE — Transfer of Care (Signed)
Immediate Anesthesia Transfer of Care Note  Patient: Diana Andersen  Procedure(s) Performed: Procedure(s) with comments: Repeat CESAREAN SECTION WITH BILATERAL TUBAL LIGATION (Bilateral) - EDD: 05/20/15 Gaylord Shih RNFA confirmed on 05/15/15  Patient Location: PACU  Anesthesia Type:Spinal  Level of Consciousness: awake, alert  and oriented  Airway & Oxygen Therapy: Patient Spontanous Breathing  Post-op Assessment: Report given to RN and Post -op Vital signs reviewed and stable  Post vital signs: Reviewed and stable  Last Vitals:  Filed Vitals:   05/18/15 0800  BP: 124/81  Pulse: 87  Temp: 36.6 C  Resp: 18    Complications: No apparent anesthesia complications

## 2015-05-19 ENCOUNTER — Encounter (HOSPITAL_COMMUNITY): Payer: Self-pay | Admitting: Obstetrics and Gynecology

## 2015-05-19 DIAGNOSIS — D62 Acute posthemorrhagic anemia: Secondary | ICD-10-CM | POA: Diagnosis not present

## 2015-05-19 LAB — CBC
HEMATOCRIT: 28.3 % — AB (ref 36.0–46.0)
Hemoglobin: 9.7 g/dL — ABNORMAL LOW (ref 12.0–15.0)
MCH: 34 pg (ref 26.0–34.0)
MCHC: 34.3 g/dL (ref 30.0–36.0)
MCV: 99.3 fL (ref 78.0–100.0)
PLATELETS: 170 10*3/uL (ref 150–400)
RBC: 2.85 MIL/uL — AB (ref 3.87–5.11)
RDW: 13.9 % (ref 11.5–15.5)
WBC: 9 10*3/uL (ref 4.0–10.5)

## 2015-05-19 LAB — GLUCOSE, CAPILLARY: Glucose-Capillary: 88 mg/dL (ref 65–99)

## 2015-05-19 LAB — BIRTH TISSUE RECOVERY COLLECTION (PLACENTA DONATION)

## 2015-05-19 MED ORDER — POLYSACCHARIDE IRON COMPLEX 150 MG PO CAPS
150.0000 mg | ORAL_CAPSULE | Freq: Every day | ORAL | Status: DC
  Administered 2015-05-19 – 2015-05-20 (×2): 150 mg via ORAL
  Filled 2015-05-19 (×2): qty 1

## 2015-05-19 NOTE — Lactation Note (Signed)
This note was copied from the chart of Diana Hang Ammon. Lactation Consultation Note Follow up visit at 8 hours of age.  Baby is just finishing a short 6 minutes breast feeding and is asleep in moms arms.  Baby recently had a bottle of formula with 84ms.  Encouraged mom to breast feeding first and offer small supplement of 7-174m after feedings.  Mom has soft breasts with loose skin, she reports breast and bottle feeding older child for about 3 months.  Hand expression did not yield colostrum at this time.  Mom encouraged to keep trying.  Discussed how bottle can affect breastfeeding, foley cup given and will need demonstration as needed at next feeding.  Last void was >12 hours ago.  Mom has hand pump at bedside encouraged to use after supplementing.    Patient Name: Diana Andersen: 05/19/2015 Reason for consult: Follow-up assessment   Maternal Data    Feeding Feeding Type: Breast Fed Length of feed: 6 min  LATCH Score/Interventions Latch: Grasps breast easily, tongue down, lips flanged, rhythmical sucking. Intervention(s): Adjust position  Audible Swallowing: A few with stimulation Intervention(s): Skin to skin  Type of Nipple: Everted at rest and after stimulation Intervention(s): No intervention needed  Comfort (Breast/Nipple): Soft / non-tender     Hold (Positioning): No assistance needed to correctly position infant at breast.  LATCH Score: 9  Lactation Tools Discussed/Used     Consult Status Consult Status: Follow-up Date: 05/20/15 Follow-up type: In-patient    ShJustice Britain0/02/2015, 10:28 PM

## 2015-05-19 NOTE — Progress Notes (Signed)
POD # 1  Subjective: Pt reports feeling well/ Pain controlled with Motrin and Percocet Tolerating po/ Foley d/c'd and voiding without problems/ No n/v/ Flatus present Activity: ad lib Bleeding is light Newborn info:  Information for the patient's newborn:  Karsten, Vaughn [[id number]]  female   Feeding: breast and bottle  Objective:  VS:  Filed Vitals:   05/18/15 1656 05/18/15 2137 05/19/15 0050 05/19/15 0510  BP: 105/62 99/60 100/62 120/66  Pulse: 56 57 62 59  Temp: 98.3 F (36.8 C) 98.3 F (36.8 C) 98.6 F (37 C) 97.9 F (36.6 C)  TempSrc: Oral Oral Oral Oral  Resp:  20 20 20   SpO2: 95% 96% 95% 96%     I&O: Intake/Output      10/06 0701 - 10/07 0700 10/07 0701 - 10/08 0700   P.O. 1640    I.V. 4282.5    Total Intake 5922.5     Urine 2750    Blood 600    Total Output 3350     Net +2572.5          Urine Occurrence 1 x       Recent Labs  05/17/15 0920 05/19/15 0535  WBC 6.7 9.0  HGB 12.1 9.7*  HCT 35.3* 28.3*  PLT 187 170    Blood type: --/--/O POS, O POS (10/05 0920) Rubella: Immune (03/11 0000)    Physical Exam:  General: alert, cooperative and no distress CV: Regular rate and rhythm Resp: CTA bilaterally Abdomen: soft, nontender, normal bowel sounds Incision: Covered with Tegaderm and honeycomb dressing; no significant drainage, edema, bruising, or erythema; well approximated with suture Uterine Fundus: firm, below umbilicus, nontender Lochia: minimal Ext: extremities normal, atraumatic, no cyanosis or edema and Homans sign is negative, no sign of DVT   Assessment: POD # 1/ G3P3001/ S/P C/Section & BTL d/t repeat  ABL anemia Doing well  Plan: Ambulate Continue routine post op orders Consider early discharge tomorrow   Signed: Julianne Handler, Delane Ginger, MSN, CNM 05/19/2015, 10:02 AM

## 2015-05-20 MED ORDER — POLYSACCHARIDE IRON COMPLEX 150 MG PO CAPS: 150.0000 mg | ORAL_CAPSULE | Freq: Every day | ORAL | Status: DC

## 2015-05-20 MED ORDER — IBUPROFEN 600 MG PO TABS: 600.0000 mg | ORAL_TABLET | Freq: Four times a day (QID) | ORAL | Status: DC

## 2015-05-20 MED ORDER — OXYCODONE-ACETAMINOPHEN 5-325 MG PO TABS: 1.0000 | ORAL_TABLET | ORAL | Status: DC | PRN

## 2015-05-20 NOTE — Progress Notes (Signed)
POSTOPERATIVE DAY # 2 S/P CS - repeat with BTL  S:         Reports feeling well - ready to go home             Tolerating po intake / no nausea / no vomiting / + flatus / + BM             Bleeding is light             Pain controlled with motrin and percocet             Up ad lib / ambulatory/ voiding QS  Newborn breast feeding  / female   O:  VS: BP 126/77 mmHg  Pulse 62  Temp(Src) 98 F (36.7 C) (Oral)  Resp 18  Ht 5' 3"  (1.6 m)  Wt 67.643 kg (149 lb 2 oz)  BMI 26.42 kg/m2  SpO2 98%  LMP 08/19/2014  Breastfeeding? Unknown   LABS:               Recent Labs  05/19/15 0535  WBC 9.0  HGB 9.7*  PLT 170               Bloodtype: --/--/O POS, O POS (10/05 0920)  Rubella: Immune (03/11 0000)              tdap and flu - current 2016                                          I&O: Intake/Output      10/07 0701 - 10/08 0700 10/08 0701 - 10/09 0700   P.O.     I.V. (mL/kg)     Total Intake(mL/kg)     Urine (mL/kg/hr) 700 (0.4)    Blood     Total Output 700     Net -700                     Physical Exam:             Alert and Oriented X3  Lungs: Clear and unlabored  Heart: regular rate and rhythm / no mumurs  Abdomen: soft, non-tender, non-distended, active BS             Fundus: firm, non-tender, U-1             Dressing intact with marker and dried drainage              Incision:  approximated with suture / no erythema / no ecchymosis / no drainage  Perineum: intact  Lochia: light  Extremities: trace edema, no calf pain or tenderness, negative Homans  A:        POD # 2 S/P Cs            Mild ABL anemia  P:        Routine postoperative care              DC home - WOB booklet - instructions reviewed   Artelia Laroche CNM, MSN, [place] 05/20/2015, 11:11 AM

## 2015-05-20 NOTE — Discharge Summary (Signed)
POSTOPERATIVE DISCHARGE SUMMARY:  Patient ID: Diana Andersen MRN: [id number] DOB/AGE: [birth year] 39 y.o.  Admit date: 05/18/2015 Admission Diagnoses: 38.6 weeks / previous CS  / undesired fertility  Discharge date:  05/20/2015 Discharge Diagnoses: POD 2 CS and BTL / ABL anemia  Prenatal history: G3P3001   EDC : 05/26/2015, by Last Menstrual Period  Prenatal care at [facility]  Primary provider : [place] Prenatal course complicated by previous CS / GDM  Prenatal Labs: ABO, Rh: --/--/O POS, O POS (10/05 0920)  Antibody: NEG (10/05 0920) Rubella: Immune (03/11 0000)  RPR: Non Reactive (10/05 0920)  HBsAg: Negative (03/11 0000)  HIV: Non-reactive (03/11 0000)  GTT : ABNORMAL - GDMa1 GBS: Negative (09/09 0000)   Medical / Surgical History :  Past medical history:  Past Medical History  Diagnosis Date  . H/O multiple allergies   . Diabetes mellitus without complication ([place])     gestational  . PONV (postoperative nausea and vomiting)     Past surgical history:  Past Surgical History  Procedure Laterality Date  . Cesarean section    . Vaginal delivery    . Cesarean section with bilateral tubal ligation Bilateral 05/18/2015    Procedure: Repeat CESAREAN SECTION WITH BILATERAL TUBAL LIGATION;  Surgeon: Brien Few, MD;  Location: [facility];  Service: Obstetrics;  Laterality: Bilateral;  EDD: 05/20/15 Gaylord Shih RNFA confirmed on 05/15/15    Family History:  Family History  Problem Relation Age of Onset  . Family history unknown: Yes    Social History:  reports that she has never smoked. She has never used smokeless tobacco. She reports that she drinks alcohol. She reports that she does not use illicit drugs.  Allergies: Review of patient's allergies indicates no known allergies.   Current Medications at time of admission:  Prior to Admission medications   Medication Sig Start Date End Date Taking? Authorizing Provider  loratadine (CLARITIN) 10 MG  tablet Take 10 mg by mouth daily.   Yes Historical Provider, MD  Prenat-FePoly-Fered-FA-Omega 3 (DUET DHA) 25-1 & 400 MG MISC Take 1 each by mouth 2 (two) times daily. Pt takes one tablet and one capsule that comes in the package.   Yes Historical Provider, MD   Procedures: Cesarean section delivery on 05/20/2015 with delivery of viable female newborn by Dr Ronita Hipps   See operative report for further details APGAR (1 MIN): 9   APGAR (5 MINS): 9    Postoperative / postpartum course:  Uncomplicated with discharge on POD 2  Discharge Instructions:  Discharged Condition: stable  Activity: pelvic rest and postoperative restrictions x 2   Diet: routine  Medications:    Medication List    TAKE these medications        DUET DHA 25-1 & 400 MG Misc  Take 1 each by mouth 2 (two) times daily. Pt takes one tablet and one capsule that comes in the package.     ibuprofen 600 MG tablet  Commonly known as:  ADVIL,MOTRIN  Take 1 tablet (600 mg total) by mouth every 6 (six) hours.     iron polysaccharides 150 MG capsule  Commonly known as:  NIFEREX  Take 1 capsule (150 mg total) by mouth daily.     loratadine 10 MG tablet  Commonly known as:  CLARITIN  Take 10 mg by mouth daily.     oxyCODONE-acetaminophen 5-325 MG tablet  Commonly known as:  PERCOCET/ROXICET  Take 1 tablet by mouth every 4 (four) hours as needed (  for pain scale 4-7).        Wound Care: keep clean and dry / remove honeycomb POD 5 Postpartum Instructions: [facility] discharge booklet - instructions reviewed  Discharge to: Home  Follow up :   [facility] in 6 weeks for routine postpartum visit with Dr Ronita Hipps                Signed: Artelia Laroche CNM, MSN, [facility] 05/20/2015, 11:24 AM

## 2015-05-20 NOTE — Lactation Note (Signed)
This note was copied from the chart of Diana Nehal Witting. Lactation Consultation Note  Patient Name: Diana Andersen Date: 05/20/2015 Reason for consult: Follow-up assessment;Infant weight loss Baby 60 hours old at 8% weight loss. Mom is attempting to BF with each feeding however baby is not sustaining a latch, falling asleep after 5-10 minutes. Mom has started to supplement with bottle. [place] assisted Mom with positioning and obtaining more depth with latch. Baby demonstrates mostly chewing at the breast, no swallows noted. On oral exam, baby is noted to have thick, short labial frenulum along with short posterior lingual frenulum. With suck exam, baby is humping his tongue and chewing on LC finger. Assisted Mom with supplementing baby at the breast using 5 fr feeding tube. Baby demonstrated a good nutritive pattern with supplement at the breast. Mom reports she liked this method to supplement. Explained also how to finger feed using this system, reviewed cleaning of system. With hand expression not able to get any colostrum today. Mom reports she did not have any breast changes early pregnancy. Due to these factors and baby not sustaining the latch, no swallows when BF, LC encouraged Mom to supplement with each feeding,  start post pumping after feedings, at least every 3 hours for 15 minutes to encourage milk production and to have EBM to supplement. Mom plans to get DEBP from insurance, advised to call today. Advised of 2 week rental program if interested. Mom reports at this time she will use her hand pump. Feeding Plan discussed: BF with each feeding, 8-12 times in 24 hours and with feeding ques. Supplement with each feeding according to guidelines given per hours of age, starting with 18 ml today. Post pump for 15 minutes at least every 3 hours.  Encouraged to schedule OP f/u next week to assess milk volume and milk transfer by baby. Mom will advise. Advised to monitor voids/stools. BM  storage guidelines advised to refer to [place] page 25. Engorgement care, page 24.  Encouraged to call for questions/concerns.   Maternal Data    Feeding Feeding Type: Breast Fed Length of feed: 5 min  LATCH Score/Interventions Latch: Grasps breast easily, tongue down, lips flanged, rhythmical sucking. (using feeding tube w/supplement at breast) Intervention(s): Adjust position;Assist with latch;Breast massage;Breast compression  Audible Swallowing: A few with stimulation  Type of Nipple: Everted at rest and after stimulation  Comfort (Breast/Nipple): Filling, red/small blisters or bruises, mild/mod discomfort  Problem noted: Mild/Moderate discomfort Interventions (Mild/moderate discomfort): Hand expression;Hand massage  Hold (Positioning): Assistance needed to correctly position infant at breast and maintain latch. Intervention(s): Breastfeeding basics reviewed;Support Pillows;Position options;Skin to skin  LATCH Score: 7  Lactation Tools Discussed/Used Tools: 52F feeding tube / Syringe;Pump Breast pump type: Manual   Consult Status Consult Status: Complete Date: 05/20/15 Follow-up type: In-patient    Katrine Coho 05/20/2015, 9:46 AM

## 2015-05-20 NOTE — Discharge Instructions (Signed)
MAGNESIUM OTC supplement 200-436m tablet daily WITH iron to PREVENT constipation

## 2015-09-19 ENCOUNTER — Other Ambulatory Visit: Payer: Self-pay | Admitting: Obstetrics and Gynecology

## 2015-09-19 DIAGNOSIS — R928 Other abnormal and inconclusive findings on diagnostic imaging of breast: Secondary | ICD-10-CM

## 2015-09-26 ENCOUNTER — Ambulatory Visit
Admission: RE | Admit: 2015-09-26 | Discharge: 2015-09-26 | Disposition: A | Discharge status: Home / Self Care | Payer: BLUE CROSS/BLUE SHIELD | Source: Ambulatory Visit | Attending: Obstetrics and Gynecology | Admitting: Obstetrics and Gynecology

## 2015-09-26 DIAGNOSIS — R928 Other abnormal and inconclusive findings on diagnostic imaging of breast: Secondary | ICD-10-CM

## 2016-06-18 ENCOUNTER — Ambulatory Visit (INDEPENDENT_AMBULATORY_CARE_PROVIDER_SITE_OTHER): Payer: Self-pay | Admitting: Nurse Practitioner

## 2016-06-18 VITALS — BP 104/72 | HR 105 | Temp 100.6°F | Resp 96 | Ht 63.0 in | Wt 154.6 lb

## 2016-06-18 DIAGNOSIS — J111 Influenza due to unidentified influenza virus with other respiratory manifestations: Secondary | ICD-10-CM

## 2016-06-18 LAB — POC INFLUENZA A&B (BINAX/QUICKVUE)
INFLUENZA A, POC: NEGATIVE
Influenza B, POC: NEGATIVE

## 2016-06-18 MED ORDER — OSELTAMIVIR PHOSPHATE 75 MG PO CAPS: 75.0000 mg | ORAL_CAPSULE | Freq: Two times a day (BID) | ORAL | 0 refills | Status: DC

## 2016-06-18 MED ORDER — OSELTAMIVIR PHOSPHATE 75 MG PO CAPS: 75.0000 mg | ORAL_CAPSULE | Freq: Two times a day (BID) | ORAL | 0 refills | Status: AC

## 2016-06-18 NOTE — Addendum Note (Signed)
Addended by: Townsend Roger D on: 06/18/2016 05:50 PM   Modules accepted: Orders

## 2016-06-18 NOTE — Patient Instructions (Addendum)
Influenza, Adult Influenza ("the flu") is a viral infection of the respiratory tract. It occurs more often in winter months because people spend more time in close contact with one another. Influenza can make you feel very sick. Influenza easily spreads from person to person (contagious). CAUSES  Influenza is caused by a virus that infects the respiratory tract. You can catch the virus by breathing in droplets from an infected person's cough or sneeze. You can also catch the virus by touching something that was recently contaminated with the virus and then touching your mouth, nose, or eyes. RISKS AND COMPLICATIONS You may be at risk for a more severe case of influenza if you smoke cigarettes, have diabetes, have chronic heart disease (such as heart failure) or lung disease (such as asthma), or if you have a weakened immune system. Elderly people and pregnant women are also at risk for more serious infections. The most common problem of influenza is a lung infection (pneumonia). Sometimes, this problem can require emergency medical care and may be life threatening. SIGNS AND SYMPTOMS  Symptoms typically last 4 to 10 days and may include:  Fever.  Chills.  Headache, body aches, and muscle aches.  Sore throat.  Chest discomfort and cough.  Poor appetite.  Weakness or feeling tired.  Dizziness.  Nausea or vomiting. DIAGNOSIS  Diagnosis of influenza is often made based on your history and a physical exam. A nose or throat swab test can be done to confirm the diagnosis. TREATMENT  In mild cases, influenza goes away on its own. Treatment is directed at relieving symptoms. For more severe cases, your health care provider may prescribe antiviral medicines to shorten the sickness. Antibiotic medicines are not effective because the infection is caused by a virus, not by bacteria. HOME CARE INSTRUCTIONS  Take medicines only as directed by your health care provider.  Use a cool mist humidifier  to make breathing easier.  Get plenty of rest until your temperature returns to normal. This usually takes 3 to 4 days.  Drink enough fluid to keep your urine clear or pale yellow.  Cover yourmouth and nosewhen coughing or sneezing,and wash your handswellto prevent thevirusfrom spreading.  Stay homefromwork orschool untilthe fever is gonefor at least 33fll day. PREVENTION  An annual influenza vaccination (flu shot) is the best way to avoid getting influenza. An annual flu shot is now routinely recommended for all adults in the [place]:  You experiencechest pain, yourcough worsens,or you producemore mucus.  Youhave nausea,vomiting, ordiarrhea.  Your fever returns or gets worse. SEEK IMMEDIATE MEDICAL CARE IF:  You havetrouble breathing, you become short of breath,or your skin ornails becomebluish.  You have severe painor stiffnessin the neck.  You develop a sudden headache, or pain in the face or ear.  You have nausea or vomiting that you cannot control. MAKE SURE YOU:   Understand these instructions.  Will watch your condition.  Will get help right away if you are not doing well or get worse.  Take Ibuprofen 4015m 60085mvery 8 hours.     This information is not intended to replace advice given to you by your health care provider. Make sure you discuss any questions you have with your health care provider.   Document Released: 07/26/2000 Document Revised: 08/19/2014 Document Reviewed: 10/28/2011 Elsevier Interactive Patient Education 201[employer]

## 2016-06-18 NOTE — Progress Notes (Signed)
   Subjective:    Patient ID: Diana Andersen, female    DOB: [birth year], 40 y.o.   MRN: [id number]  The patient is a 40 y.o. Female that presents with complaints of fever, fatigue, headache, cough, muscle aches, congestion, and loss of appetite x 4 hours.  The patient was recently treated for a sinus infection and was given Augmentin.  The patient completed the Augmentin and stated she suddenly fell ill today.  The patient denies runny nose, sore throat, or abd pain.  The patient took Tylenol approximately 25 minutes pta.  The patient has a history of gestational diabetes and seasonal allergies.  Denies asthma, bronchitis or pneumonia.        Review of Systems  Constitutional: Positive for activity change, appetite change, chills, diaphoresis, fatigue and fever.  HENT: Positive for congestion and postnasal drip. Negative for ear pain, sore throat and trouble swallowing.   Eyes: Negative.   Respiratory: Positive for shortness of breath.        Mild  Gastrointestinal: Negative for abdominal pain, diarrhea, nausea and vomiting.  Skin: Negative.   Allergic/Immunologic: Positive for environmental allergies.  Neurological: Positive for headaches.       Objective:   Physical Exam  Constitutional: She is oriented to person, place, and time. She appears well-developed and well-nourished.  Mild discomfort observed.  HENT:  Head: Normocephalic.  Right Ear: External ear normal.  Left Ear: External ear normal.  Eyes: Conjunctivae and EOM are normal. Pupils are equal, round, and reactive to light. Right eye exhibits no discharge. Left eye exhibits no discharge.  Neck: Normal range of motion. Neck supple. No thyromegaly present.  Cardiovascular: Normal rate and regular rhythm.   Mild tachycardia.  Pulmonary/Chest: Effort normal and breath sounds normal. No stridor. She has no wheezes. She has no rales.  Abdominal: Soft. Bowel sounds are normal. She exhibits no distension.  Neurological: She is  alert and oriented to person, place, and time.  Skin: Skin is warm. She is diaphoretic.  Warm, moist  Psychiatric: She has a normal mood and affect. Her behavior is normal. Judgment and thought content normal.          Assessment & Plan:  Patient provided patient education for influenza.  Influenza swab negative, but patient presented clinically.  Education provided regarding symptomatic treatment.  Patient to f/u if no improvement.

## 2016-08-30 ENCOUNTER — Other Ambulatory Visit: Payer: Self-pay | Admitting: Family

## 2016-08-30 DIAGNOSIS — Z1231 Encounter for screening mammogram for malignant neoplasm of breast: Secondary | ICD-10-CM

## 2016-10-09 ENCOUNTER — Ambulatory Visit
Admission: RE | Admit: 2016-10-09 | Discharge: 2016-10-09 | Disposition: A | Discharge status: Home / Self Care | Payer: PRIVATE HEALTH INSURANCE | Source: Ambulatory Visit | Attending: Family | Admitting: Family

## 2016-10-09 DIAGNOSIS — Z1231 Encounter for screening mammogram for malignant neoplasm of breast: Secondary | ICD-10-CM

## 2017-06-13 NOTE — Progress Notes (Signed)
Diana Andersen received her flu shot at [facility] # [id number] NDC: [id number] Mfg: [employer] Expires: 02/08/18 To Left Deltoid

## 2017-08-29 ENCOUNTER — Other Ambulatory Visit: Payer: Self-pay | Admitting: Obstetrics and Gynecology

## 2017-08-29 DIAGNOSIS — Z1231 Encounter for screening mammogram for malignant neoplasm of breast: Secondary | ICD-10-CM

## 2017-10-10 ENCOUNTER — Ambulatory Visit
Admission: RE | Admit: 2017-10-10 | Discharge: 2017-10-10 | Disposition: A | Discharge status: Home / Self Care | Payer: PRIVATE HEALTH INSURANCE | Source: Ambulatory Visit | Attending: Obstetrics and Gynecology | Admitting: Obstetrics and Gynecology

## 2017-10-10 DIAGNOSIS — Z1231 Encounter for screening mammogram for malignant neoplasm of breast: Secondary | ICD-10-CM

## 2018-08-09 ENCOUNTER — Ambulatory Visit: Payer: Self-pay | Admitting: Family Medicine

## 2018-08-09 ENCOUNTER — Encounter: Payer: Self-pay | Admitting: Family Medicine

## 2018-08-09 VITALS — BP 110/75 | HR 81 | Temp 98.8°F | Resp 16 | Wt 111.6 lb

## 2018-08-09 DIAGNOSIS — R0981 Nasal congestion: Secondary | ICD-10-CM

## 2018-08-09 DIAGNOSIS — R059 Cough, unspecified: Secondary | ICD-10-CM

## 2018-08-09 DIAGNOSIS — J069 Acute upper respiratory infection, unspecified: Secondary | ICD-10-CM

## 2018-08-09 DIAGNOSIS — R05 Cough: Secondary | ICD-10-CM

## 2018-08-09 MED ORDER — PROMETHAZINE-DM 6.25-15 MG/5ML PO SYRP: 5.0000 mL | ORAL_SOLUTION | Freq: Three times a day (TID) | ORAL | 0 refills | Status: DC | PRN

## 2018-08-09 MED ORDER — AZELASTINE HCL 0.1 % NA SOLN: 1.0000 | Freq: Two times a day (BID) | NASAL | 0 refills | Status: DC

## 2018-08-09 MED ORDER — BENZONATATE 100 MG PO CAPS: 100.0000 mg | ORAL_CAPSULE | Freq: Two times a day (BID) | ORAL | 0 refills | Status: DC | PRN

## 2018-08-09 MED ORDER — MONTELUKAST SODIUM 10 MG PO TABS: 10.0000 mg | ORAL_TABLET | Freq: Every day | ORAL | 3 refills | Status: DC

## 2018-08-09 NOTE — Progress Notes (Signed)
Subjective:   Diana Andersen is a 42 y.o. female presenting for chief complaint of Sinus Problem (x 1 week); Headache (x 1 week, on and off); and Cough (x 1 week ) .Reports 5 day  history of sinus congestion and rhinorrhea, chills, fatigue, malaise, nausea, vomiting and diarrhea. Has tried Nasacort, Human resources officer, and pseudoephed relief. Reports a history of seasonal allergies, history of asthma. Patient has flu shot this season. Denies smoking. Denies any other aggravating or relieving factors, no other questions or concerns. Concerned about being contagious with spouse who just had open heart surgery.   Diana Andersen has a current medication list which includes the following prescription(s): aspirin, calcium carbonate, dextromethorphan-guaifenesin, fexofenadine, multivitamin, triamcinolone acetonide, azelastine, benzonatate, montelukast, and promethazine-dextromethorphan. Also has No Known Allergies.  Diana Andersen  has a past medical history of Diabetes mellitus without complication ([place]), H/O multiple allergies, and PONV (postoperative nausea and vomiting). Also  has a past surgical history that includes Cesarean section; Vaginal delivery; and Cesarean section with bilateral tubal ligation (Bilateral, 05/18/2015).   Objective:   Vitals: BP 110/75 (BP Location: Right Arm, Patient Position: Sitting, Cuff Size: Normal)   Pulse 81   Temp 98.8 F (37.1 C) (Oral)   Resp 16   Wt 111 lb 9.6 oz (50.6 kg)   SpO2 98%   BMI 19.77 kg/m   Physical Exam Vitals signs reviewed.  Constitutional:      General: She is not in acute distress.    Appearance: Normal appearance. She is well-developed and normal weight. She is not ill-appearing, toxic-appearing or diaphoretic.  HENT:     Head: Normocephalic.     Right Ear: Hearing, tympanic membrane, ear canal and external ear normal.     Left Ear: Hearing, tympanic membrane, ear canal and external ear normal.     Nose: Congestion and rhinorrhea present. No mucosal edema.   Right Sinus: No maxillary sinus tenderness or frontal sinus tenderness.     Left Sinus: No maxillary sinus tenderness or frontal sinus tenderness.     Mouth/Throat:     Pharynx: Uvula midline.  Neck:     Musculoskeletal: Normal range of motion and neck supple.  Cardiovascular:     Rate and Rhythm: Normal rate and regular rhythm.     Pulses: Normal pulses.     Heart sounds: Normal heart sounds.  Pulmonary:     Effort: Pulmonary effort is normal.     Breath sounds: Normal breath sounds. No decreased breath sounds, wheezing, rhonchi or rales.     Comments: Frequent dry non productive cough on exam Abdominal:     General: Bowel sounds are normal.     Palpations: Abdomen is soft.  Musculoskeletal: Normal range of motion.  Lymphadenopathy:     Head:     Right side of head: No submental or submandibular adenopathy.     Left side of head: No submental or submandibular adenopathy.     Cervical: No cervical adenopathy.  Skin:    General: Skin is warm.  Neurological:     Mental Status: She is alert and oriented to person, place, and time.  Psychiatric:        Behavior: Behavior is cooperative.     No results found for this or any previous visit (from the past 24 hour(s)).  Assessment and Plan :  1. Upper respiratory tract infection, unspecified type Patient is well appearing on exam with no concerning finding for bacterial infection- discussed the likely viral nature of symptoms and advised to use supportive  treatment and continue to monitor for changes. Discussed good cough and hand hygiene related to husbands medical condition.  - promethazine-dextromethorphan (PROMETHAZINE-DM) 6.25-15 MG/5ML syrup; Take 5 mLs by mouth 3 (three) times daily as needed for cough.  Dispense: 118 mL; Refill: 0  2. Nasal congestion Patient with evidence of rhinorrhea on exam and erythremic nasal cavity benefit of nasal spray during acute phase of URI may be beneficial. - azelastine (ASTELIN) 0.1 % nasal  spray; Place 1 spray into both nostrils 2 (two) times daily. Use in each nostril as directed  Dispense: 30 mL; Refill: 0  3. Cough Patient requesting Prednisone- discussed indications and side effects of this medication in light of no audible wheezing and short duration of symptoms discussed benefit of monitoring condition and re-evaluation for changes. Patient was amenable to this plan.  - montelukast (SINGULAIR) 10 MG tablet; Take 1 tablet (10 mg total) by mouth at bedtime.  Dispense: 30 tablet; Refill: 3 - benzonatate (TESSALON) 100 MG capsule; Take 1 capsule (100 mg total) by mouth 2 (two) times daily as needed for cough.  Dispense: 20 capsule; Refill: 0   Discussed exam findings, diagnosis etiology and medication use and indications reviewed with patient. Follow- Up and discharge instructions provided. No emergent/urgent issues found on exam.  Patient verbalized understanding of information provided and agrees with plan of care (POC), all questions answered.   08/09/2018 1:01 PM

## 2018-08-09 NOTE — Patient Instructions (Signed)
Upper Respiratory Infection, Adult An upper respiratory infection (URI) affects the nose, throat, and upper air passages. URIs are caused by germs (viruses). The most common type of URI is often called "the common cold." Medicines cannot cure URIs, but you can do things at home to relieve your symptoms. URIs usually get better within 7-10 days. Follow these instructions at home: Activity  Rest as needed.  If you have a fever, stay home from work or school until your fever is gone, or until your doctor says you may return to work or school. ? You should stay home until you cannot spread the infection anymore (you are not contagious). ? Your doctor may have you wear a face mask so you have less risk of spreading the infection. Relieving symptoms  Gargle with a salt-water mixture 3-4 times a day or as needed. To make a salt-water mixture, completely dissolve -1 tsp of salt in 1 cup of warm water.  Use a cool-mist humidifier to add moisture to the air. This can help you breathe more easily. Eating and drinking   Drink enough fluid to keep your pee (urine) pale yellow.  Eat soups and other clear broths. General instructions   Take over-the-counter and prescription medicines only as told by your doctor. These include cold medicines, fever reducers, and cough suppressants.  Do not use any products that contain nicotine or tobacco. These include cigarettes and e-cigarettes. If you need help quitting, ask your doctor.  Avoid being where people are smoking (avoid secondhand smoke).  Make sure you get regular shots and get the flu shot every year.  Keep all follow-up visits as told by your doctor. This is important. How to avoid spreading infection to others   Wash your hands often with soap and water. If you do not have soap and water, use hand sanitizer.  Avoid touching your mouth, face, eyes, or nose.  Cough or sneeze into a tissue or your sleeve or elbow. Do not cough or sneeze  into your hand or into the air. Contact a doctor if:  You are getting worse, not better.  You have any of these: ? A fever. ? Chills. ? Brown or red mucus in your nose. ? Yellow or brown fluid (discharge)coming from your nose. ? Pain in your face, especially when you bend forward. ? Swollen neck glands. ? Pain with swallowing. ? White areas in the back of your throat. Get help right away if:  You have shortness of breath that gets worse.  You have very bad or constant: ? Headache. ? Ear pain. ? Pain in your forehead, behind your eyes, and over your cheekbones (sinus pain). ? Chest pain.  You have long-lasting (chronic) lung disease along with any of these: ? Wheezing. ? Long-lasting cough. ? Coughing up blood. ? A change in your usual mucus.  You have a stiff neck.  You have changes in your: ? Vision. ? Hearing. ? Thinking. ? Mood. Summary  An upper respiratory infection (URI) is caused by a germ called a virus. The most common type of URI is often called "the common cold."  URIs usually get better within 7-10 days.  Take over-the-counter and prescription medicines only as told by your doctor. This information is not intended to replace advice given to you by your health care provider. Make sure you discuss any questions you have with your health care provider. Document Released: 01/15/2008 Document Revised: 03/21/2017 Document Reviewed: 03/21/2017 Elsevier Interactive Patient Education  2019 [employer].

## 2018-09-21 ENCOUNTER — Other Ambulatory Visit: Payer: Self-pay | Admitting: Obstetrics and Gynecology

## 2018-09-21 DIAGNOSIS — Z1231 Encounter for screening mammogram for malignant neoplasm of breast: Secondary | ICD-10-CM

## 2018-10-21 ENCOUNTER — Other Ambulatory Visit: Payer: Self-pay

## 2018-10-21 ENCOUNTER — Ambulatory Visit
Admission: RE | Admit: 2018-10-21 | Discharge: 2018-10-21 | Disposition: A | Discharge status: Home / Self Care | Payer: 59 | Source: Ambulatory Visit | Attending: Obstetrics and Gynecology | Admitting: Obstetrics and Gynecology

## 2018-10-21 DIAGNOSIS — Z1231 Encounter for screening mammogram for malignant neoplasm of breast: Secondary | ICD-10-CM

## 2019-09-02 ENCOUNTER — Other Ambulatory Visit: Payer: Self-pay | Admitting: Obstetrics and Gynecology

## 2019-09-02 DIAGNOSIS — Z1231 Encounter for screening mammogram for malignant neoplasm of breast: Secondary | ICD-10-CM

## 2019-09-28 DIAGNOSIS — H52223 Regular astigmatism, bilateral: Secondary | ICD-10-CM | POA: Diagnosis not present

## 2019-09-28 DIAGNOSIS — H5213 Myopia, bilateral: Secondary | ICD-10-CM | POA: Diagnosis not present

## 2019-09-28 DIAGNOSIS — H524 Presbyopia: Secondary | ICD-10-CM | POA: Diagnosis not present

## 2019-10-05 DIAGNOSIS — Z1151 Encounter for screening for human papillomavirus (HPV): Secondary | ICD-10-CM | POA: Diagnosis not present

## 2019-10-05 DIAGNOSIS — Z8349 Family history of other endocrine, nutritional and metabolic diseases: Secondary | ICD-10-CM | POA: Diagnosis not present

## 2019-10-05 DIAGNOSIS — Z01419 Encounter for gynecological examination (general) (routine) without abnormal findings: Secondary | ICD-10-CM | POA: Diagnosis not present

## 2019-10-05 DIAGNOSIS — Z13220 Encounter for screening for lipoid disorders: Secondary | ICD-10-CM | POA: Diagnosis not present

## 2019-10-05 DIAGNOSIS — Z6820 Body mass index (BMI) 20.0-20.9, adult: Secondary | ICD-10-CM | POA: Diagnosis not present

## 2019-10-05 DIAGNOSIS — E039 Hypothyroidism, unspecified: Secondary | ICD-10-CM | POA: Diagnosis not present

## 2019-10-06 MED FILL — LEVOTHYROXINE SODIUM 25 MCG: 25 | 30 days supply | Qty: 30 | Fill #0

## 2019-10-13 ENCOUNTER — Ambulatory Visit
Admission: RE | Admit: 2019-10-13 | Discharge: 2019-10-13 | Disposition: A | Discharge status: Home / Self Care | Payer: 59 | Source: Ambulatory Visit | Attending: Obstetrics and Gynecology | Admitting: Obstetrics and Gynecology

## 2019-10-13 ENCOUNTER — Other Ambulatory Visit: Payer: Self-pay

## 2019-10-13 DIAGNOSIS — Z1231 Encounter for screening mammogram for malignant neoplasm of breast: Secondary | ICD-10-CM

## 2019-10-18 MED FILL — AZELASTINE HCL 137 MCG/SPRA: 137 | 25 days supply | Qty: 30 | Fill #0

## 2019-10-30 MED FILL — LEVOTHYROXINE SODIUM 25 MCG: 25 | 30 days supply | Qty: 30 | Fill #1

## 2019-11-24 DIAGNOSIS — Z1329 Encounter for screening for other suspected endocrine disorder: Secondary | ICD-10-CM | POA: Diagnosis not present

## 2019-11-24 DIAGNOSIS — E039 Hypothyroidism, unspecified: Secondary | ICD-10-CM | POA: Diagnosis not present

## 2019-11-24 MED FILL — LEVOTHYROXINE 50 MCG TABLET: 50 | 30 days supply | Qty: 30 | Fill #0

## 2019-12-27 MED FILL — LEVOTHYROXINE 50 MCG TABLET: 50 | 30 days supply | Qty: 30 | Fill #1

## 2020-01-06 DIAGNOSIS — E039 Hypothyroidism, unspecified: Secondary | ICD-10-CM | POA: Diagnosis not present

## 2020-02-21 MED FILL — LEVOTHYROXINE 50 MCG TABLET: 50 | 30 days supply | Qty: 30 | Fill #1

## 2020-03-27 ENCOUNTER — Other Ambulatory Visit (HOSPITAL_COMMUNITY): Payer: Self-pay | Admitting: Obstetrics and Gynecology

## 2020-03-27 MED FILL — LEVOTHYROXINE 50 MCG TABLET: 50 | 90 days supply | Qty: 90 | Fill #0

## 2020-04-11 DIAGNOSIS — L821 Other seborrheic keratosis: Secondary | ICD-10-CM | POA: Diagnosis not present

## 2020-04-28 MED FILL — FLUARIX QUADRIVALENT 0.5 ML: 0.5 | 1 days supply | Qty: 1 | Fill #0

## 2020-06-15 ENCOUNTER — Other Ambulatory Visit: Payer: PRIVATE HEALTH INSURANCE

## 2020-06-15 DIAGNOSIS — Z20822 Contact with and (suspected) exposure to covid-19: Secondary | ICD-10-CM

## 2020-06-16 LAB — SARS-COV-2, NAA 2 DAY TAT

## 2020-06-16 LAB — NOVEL CORONAVIRUS, NAA: SARS-CoV-2, NAA: NOT DETECTED

## 2020-06-21 MED FILL — AZELASTINE HCL 137 MCG/SPRA: 137 | 25 days supply | Qty: 30 | Fill #1

## 2020-06-21 MED FILL — LEVOTHYROXINE 50 MCG TABLET: 50 | 90 days supply | Qty: 90 | Fill #1

## 2020-06-23 ENCOUNTER — Other Ambulatory Visit (HOSPITAL_COMMUNITY): Payer: Self-pay | Admitting: Emergency Medicine

## 2020-06-23 ENCOUNTER — Other Ambulatory Visit: Payer: Self-pay

## 2020-06-23 ENCOUNTER — Encounter (HOSPITAL_COMMUNITY): Payer: Self-pay | Admitting: Emergency Medicine

## 2020-06-23 ENCOUNTER — Ambulatory Visit (HOSPITAL_COMMUNITY)
Admission: EM | Admit: 2020-06-23 | Discharge: 2020-06-23 | Disposition: A | Discharge status: Home / Self Care | Payer: 59 | Attending: Emergency Medicine | Admitting: Emergency Medicine

## 2020-06-23 DIAGNOSIS — J22 Unspecified acute lower respiratory infection: Secondary | ICD-10-CM

## 2020-06-23 DIAGNOSIS — J302 Other seasonal allergic rhinitis: Secondary | ICD-10-CM

## 2020-06-23 DIAGNOSIS — R059 Cough, unspecified: Secondary | ICD-10-CM

## 2020-06-23 MED ORDER — BENZONATATE 200 MG PO CAPS: 200.0000 mg | ORAL_CAPSULE | Freq: Three times a day (TID) | ORAL | 0 refills | Status: DC | PRN

## 2020-06-23 MED ORDER — AZITHROMYCIN 250 MG PO TABS: 250.0000 mg | ORAL_TABLET | Freq: Every day | ORAL | 0 refills | Status: DC

## 2020-06-23 MED ORDER — MONTELUKAST SODIUM 10 MG PO TABS: 10.0000 mg | ORAL_TABLET | Freq: Every day | ORAL | 3 refills | Status: DC

## 2020-06-23 MED FILL — BENZONATATE 200 MG CAP: 200 | 9 days supply | Qty: 28 | Fill #0

## 2020-06-23 MED FILL — AZITHROMYCIN 250 MG TABLET: 250 | 5 days supply | Qty: 6 | Fill #0

## 2020-06-23 MED FILL — MONTELUKAST SOD 10 MG TAB: 10 | 30 days supply | Qty: 30 | Fill #0

## 2020-06-23 NOTE — Discharge Instructions (Signed)
Continue allegra, Astelin, add in daily Singulair Tessalon as needed for cough Azithromycin x 5 days Follow up if not improving or worsening

## 2020-06-23 NOTE — ED Triage Notes (Addendum)
Patient c/o non-productive cough since last week.   Patient denies SOB and fever.   Patient has COVID-19 test done 11/4 w/ negative test result. These results appear in our system.   Patient has taken allergy medication and nasal spray w/ some relief of symptoms.   History of seasonal allergies.

## 2020-06-23 NOTE — ED Provider Notes (Signed)
[place]    CSN: [id number] Arrival date & time: 06/23/20  7225      History   Chief Complaint Chief Complaint  Patient presents with   Cough    HPI Diana Andersen is a 44 y.o. female presenting today for evaluation of a cough.  Patient reports that she has had a cough for approximately 1 week.  Initially had slight congestion, but has been using saline spray, Astelin as well as Allegra.  She reports that her cough has been persistent.  Attributes cough to allergies has had similar symptoms around this time of the year annually.  She denies any fever fatigue body aches or chills.  Energy level has been normal.  Denies any congestion or sore throat.  Reports improvement with Tessalon in the past.  Previously Covid tested on 11/4 and was negative.  HPI  Past Medical History:  Diagnosis Date   Diabetes mellitus without complication ([place])    gestational   H/O multiple allergies    PONV (postoperative nausea and vomiting)     Patient Active Problem List   Diagnosis Date Noted   Acute blood loss anemia 05/19/2015   Cesarean delivery delivered (10/6) 05/18/2015    Past Surgical History:  Procedure Laterality Date   CESAREAN SECTION     CESAREAN SECTION WITH BILATERAL TUBAL LIGATION Bilateral 05/18/2015   Procedure: Repeat CESAREAN SECTION WITH BILATERAL TUBAL LIGATION;  Surgeon: Brien Few, MD;  Location: [facility];  Service: Obstetrics;  Laterality: Bilateral;  EDD: 05/20/15 Gaylord Shih RNFA confirmed on 05/15/15   VAGINAL DELIVERY      OB History    Gravida  3   Para  3   Term  3   Preterm      AB      Living  1     SAB      TAB      Ectopic      Multiple  0   Live Births  1            Home Medications    Prior to Admission medications   Medication Sig Start Date End Date Taking? Authorizing Provider  aspirin 81 MG tablet Take 81 mg by mouth daily.   Yes [provider]  azelastine (ASTELIN) 0.1 % nasal spray  Place 1 spray into both nostrils 2 (two) times daily. Use in each nostril as directed 08/09/18  Yes Shella Maxim, NP  calcium carbonate (OSCAL) 1500 (600 Ca) MG TABS tablet Take by mouth 2 (two) times daily with a meal.   Yes [provider]  fexofenadine (ALLEGRA) 60 MG tablet Take 60 mg by mouth 2 (two) times daily.   Yes [provider]  Multiple Vitamin (MULTIVITAMIN) tablet Take 1 tablet by mouth daily.   Yes [provider]  azithromycin (ZITHROMAX) 250 MG tablet Take 1 tablet (250 mg total) by mouth daily. Take first 2 tablets together, then 1 every day until finished. 06/23/20   Shamica Moree C, PA-C  benzonatate (TESSALON) 200 MG capsule Take 1 capsule (200 mg total) by mouth 3 (three) times daily as needed for up to 7 days for cough. 06/23/20 06/30/20  Pattrick Bady C, PA-C  dextromethorphan-guaiFENesin (MUCINEX DM) 30-600 MG 12hr tablet Take 1 tablet by mouth 2 (two) times daily.    [provider]  montelukast (SINGULAIR) 10 MG tablet Take 1 tablet (10 mg total) by mouth at bedtime. 06/23/20   Monik Lins C, PA-C  Triamcinolone Acetonide (  NASACORT ALLERGY 24HR NA) Place into the nose.    [provider]    Family History Family History  Problem Relation Age of Onset   Breast cancer Neg Hx     Social History Social History   Tobacco Use   Smoking status: Never Smoker   Smokeless tobacco: Never Used  Substance Use Topics   Alcohol use: Yes    Comment: None since LMP   Drug use: No     Allergies   Patient has no known allergies.   Review of Systems Review of Systems  Constitutional: Negative for activity change, appetite change, chills, fatigue and fever.  HENT: Negative for congestion, ear pain, rhinorrhea, sinus pressure, sore throat and trouble swallowing.   Eyes: Negative for discharge and redness.  Respiratory: Positive for cough. Negative for chest tightness and shortness of breath.   Cardiovascular:  Negative for chest pain.  Gastrointestinal: Negative for abdominal pain, diarrhea, nausea and vomiting.  Musculoskeletal: Negative for myalgias.  Skin: Negative for rash.  Neurological: Negative for dizziness, light-headedness and headaches.     Physical Exam Triage Vital Signs ED Triage Vitals  Enc Vitals Group     BP 06/23/20 1012 (!) 150/92     Pulse Rate 06/23/20 1012 (!) 55     Resp 06/23/20 1015 16     Temp 06/23/20 1019 98.4 F (36.9 C)     Temp Source 06/23/20 1019 Oral     SpO2 06/23/20 1012 100 %     Weight --      Height --      Head Circumference --      Peak Flow --      Pain Score 06/23/20 1008 0     Pain Loc --      Pain Edu? --      Excl. in [place]? --    No data found.  Updated Vital Signs BP (!) 150/92 (BP Location: Left Arm)    Pulse (!) 55    Temp 98.4 F (36.9 C) (Oral)    Resp 16    LMP 06/23/2020    SpO2 100%   Visual Acuity Right Eye Distance:   Left Eye Distance:   Bilateral Distance:    Right Eye Near:   Left Eye Near:    Bilateral Near:     Physical Exam Vitals and nursing note reviewed.  Constitutional:      Appearance: She is well-developed.     Comments: No acute distress  HENT:     Head: Normocephalic and atraumatic.     Ears:     Comments: Bilateral ears without tenderness to palpation of external auricle, tragus and mastoid, EAC's without erythema or swelling, TM's with good bony landmarks and cone of light. Non erythematous.     Nose: Nose normal.     Mouth/Throat:     Comments: Oral mucosa pink and moist, no tonsillar enlargement or exudate. Posterior pharynx patent and nonerythematous, no uvula deviation or swelling. Normal phonation. Eyes:     Conjunctiva/sclera: Conjunctivae normal.  Cardiovascular:     Rate and Rhythm: Normal rate.  Pulmonary:     Effort: Pulmonary effort is normal. No respiratory distress.     Comments: Breathing comfortably at rest, CTABL, no wheezing, rales or other adventitious sounds  auscultated Abdominal:     General: There is no distension.  Musculoskeletal:        General: Normal range of motion.     Cervical back: Neck supple.  Skin:  General: Skin is warm and dry.  Neurological:     Mental Status: She is alert and oriented to person, place, and time.      UC Treatments / Results  Labs (all labs ordered are listed, but only abnormal results are displayed) Labs Reviewed - No data to display  EKG   Radiology No results found.  Procedures Procedures (including critical care time)  Medications Ordered in UC Medications - No data to display  Initial Impression / Assessment and Plan / UC Course  I have reviewed the triage vital signs and the nursing notes.  Pertinent labs & imaging results that were available during my care of the patient were reviewed by me and considered in my medical decision making (see chart for details).     Cough x1 to 2 weeks, reports history of allergies, continue current allergy medicines, will refill Singulair which has been beneficial in the past.  Tessalon for cough as needed.  Given length of symptoms also covering with azithromycin to cover atypicals.  Discussed strict return precautions. Patient verbalized understanding and is agreeable with plan.  Final Clinical Impressions(s) / UC Diagnoses   Final diagnoses:  Cough  Lower resp. tract infection  Seasonal allergic rhinitis, unspecified trigger     Discharge Instructions     Continue allegra, Astelin, add in daily Singulair Tessalon as needed for cough Azithromycin x 5 days Follow up if not improving or worsening    ED Prescriptions    Medication Sig Dispense Auth. Provider   benzonatate (TESSALON) 200 MG capsule Take 1 capsule (200 mg total) by mouth 3 (three) times daily as needed for up to 7 days for cough. 28 capsule Sandeep Delagarza C, PA-C   azithromycin (ZITHROMAX) 250 MG tablet Take 1 tablet (250 mg total) by mouth daily. Take first 2 tablets  together, then 1 every day until finished. 6 tablet Saprina Chuong C, PA-C   montelukast (SINGULAIR) 10 MG tablet Take 1 tablet (10 mg total) by mouth at bedtime. 30 tablet Prisca Gearing, [place] C, PA-C     PDMP not reviewed this encounter.   Janith Lima, PA-C 06/23/20 1038

## 2020-07-11 ENCOUNTER — Other Ambulatory Visit (HOSPITAL_COMMUNITY): Payer: Self-pay | Admitting: Allergy and Immunology

## 2020-07-11 DIAGNOSIS — R062 Wheezing: Secondary | ICD-10-CM | POA: Diagnosis not present

## 2020-07-11 DIAGNOSIS — J301 Allergic rhinitis due to pollen: Secondary | ICD-10-CM | POA: Diagnosis not present

## 2020-07-11 DIAGNOSIS — J3089 Other allergic rhinitis: Secondary | ICD-10-CM | POA: Diagnosis not present

## 2020-07-11 DIAGNOSIS — J3081 Allergic rhinitis due to animal (cat) (dog) hair and dander: Secondary | ICD-10-CM | POA: Diagnosis not present

## 2020-07-11 MED FILL — AZELASTINE HCL 137 MCG/SPRA: 137 | 30 days supply | Qty: 30 | Fill #0

## 2020-07-11 MED FILL — ALBUTEROL SULFATE HFA 108 (: 108 (90 BAS | 25 days supply | Qty: 9 | Fill #0

## 2020-09-06 DIAGNOSIS — Z20828 Contact with and (suspected) exposure to other viral communicable diseases: Secondary | ICD-10-CM | POA: Diagnosis not present

## 2020-09-07 DIAGNOSIS — Z20828 Contact with and (suspected) exposure to other viral communicable diseases: Secondary | ICD-10-CM | POA: Diagnosis not present

## 2020-09-08 DIAGNOSIS — Z20828 Contact with and (suspected) exposure to other viral communicable diseases: Secondary | ICD-10-CM | POA: Diagnosis not present

## 2020-09-18 MED FILL — LEVOTHYROXINE 50 MCG TABLET: 50 | 90 days supply | Qty: 90 | Fill #2

## 2020-09-19 ENCOUNTER — Other Ambulatory Visit: Payer: Self-pay | Admitting: Obstetrics and Gynecology

## 2020-09-19 DIAGNOSIS — Z20828 Contact with and (suspected) exposure to other viral communicable diseases: Secondary | ICD-10-CM | POA: Diagnosis not present

## 2020-09-19 DIAGNOSIS — Z1231 Encounter for screening mammogram for malignant neoplasm of breast: Secondary | ICD-10-CM

## 2020-10-05 ENCOUNTER — Ambulatory Visit (INDEPENDENT_AMBULATORY_CARE_PROVIDER_SITE_OTHER): Payer: 59 | Admitting: Family Medicine

## 2020-10-05 ENCOUNTER — Encounter: Payer: Self-pay | Admitting: Family Medicine

## 2020-10-05 ENCOUNTER — Other Ambulatory Visit: Payer: Self-pay

## 2020-10-05 VITALS — BP 135/95 | HR 64 | Temp 98.1°F | Ht 63.0 in | Wt 116.4 lb

## 2020-10-05 DIAGNOSIS — Z0001 Encounter for general adult medical examination with abnormal findings: Secondary | ICD-10-CM

## 2020-10-05 DIAGNOSIS — Z1211 Encounter for screening for malignant neoplasm of colon: Secondary | ICD-10-CM | POA: Diagnosis not present

## 2020-10-05 DIAGNOSIS — E039 Hypothyroidism, unspecified: Secondary | ICD-10-CM

## 2020-10-05 DIAGNOSIS — J309 Allergic rhinitis, unspecified: Secondary | ICD-10-CM

## 2020-10-05 NOTE — Assessment & Plan Note (Signed)
Managed by OB/GYN.  She is on Synthroid 50 mcg daily.

## 2020-10-05 NOTE — Patient Instructions (Signed)
It was very nice to see you today!  We will place an order for your colonoscopy.  No other changes today.  Please follow-up with Dr. Dahlia Byes soon.  I would like to see you back in a year or so for your next checkup.  Please come back to see me sooner if needed.  Take care, Dr Jerline Pain  Please try these tips to maintain a healthy lifestyle:   Eat at least 3 REAL meals and 1-2 snacks per day.  Aim for no more than 5 hours between eating.  If you eat breakfast, please do so within one hour of getting up.    Each meal should contain half fruits/vegetables, one quarter protein, and one quarter carbs (no bigger than a computer mouse)   Cut down on sweet beverages. This includes juice, soda, and sweet tea.     Drink at least 1 glass of water with each meal and aim for at least 8 glasses per day   Exercise at least 150 minutes every week.    Preventive Care 23-4 Years Old, Female Preventive care refers to lifestyle choices and visits with your health care provider that can promote health and wellness. This includes:  A yearly physical exam. This is also called an annual wellness visit.  Regular dental and eye exams.  Immunizations.  Screening for certain conditions.  Healthy lifestyle choices, such as: ? Eating a healthy diet. ? Getting regular exercise. ? Not using drugs or products that contain nicotine and tobacco. ? Limiting alcohol use. What can I expect for my preventive care visit? Physical exam Your health care provider will check your:  Height and weight. These may be used to calculate your BMI (body mass index). BMI is a measurement that tells if you are at a healthy weight.  Heart rate and blood pressure.  Body temperature.  Skin for abnormal spots. Counseling Your health care provider may ask you questions about your:  Past medical problems.  Family's medical history.  Alcohol, tobacco, and drug use.  Emotional well-being.  Home life and  relationship well-being.  Sexual activity.  Diet, exercise, and sleep habits.  Work and work Statistician.  Access to firearms.  Method of birth control.  Menstrual cycle.  Pregnancy history. What immunizations do I need? Vaccines are usually given at various ages, according to a schedule. Your health care provider will recommend vaccines for you based on your age, medical history, and lifestyle or other factors, such as travel or where you work.   What tests do I need? Blood tests  Lipid and cholesterol levels. These may be checked every 5 years, or more often if you are over 87 years old.  Hepatitis C test.  Hepatitis B test. Screening  Lung cancer screening. You may have this screening every year starting at age 109 if you have a 30-pack-year history of smoking and currently smoke or have quit within the past 15 years.  Colorectal cancer screening. ? All adults should have this screening starting at age 58 and continuing until age 50. ? Your health care provider may recommend screening at age 49 if you are at increased risk. ? You will have tests every 1-10 years, depending on your results and the type of screening test.  Diabetes screening. ? This is done by checking your blood sugar (glucose) after you have not eaten for a while (fasting). ? You may have this done every 1-3 years.  Mammogram. ? This may be done every 1-2 years. ?  Talk with your health care provider about when you should start having regular mammograms. This may depend on whether you have a family history of breast cancer.  BRCA-related cancer screening. This may be done if you have a family history of breast, ovarian, tubal, or peritoneal cancers.  Pelvic exam and Pap test. ? This may be done every 3 years starting at age 80. ? Starting at age 81, this may be done every 5 years if you have a Pap test in combination with an HPV test. Other tests  STD (sexually transmitted disease) testing, if you  are at risk.  Bone density scan. This is done to screen for osteoporosis. You may have this scan if you are at high risk for osteoporosis. Talk with your health care provider about your test results, treatment options, and if necessary, the need for more tests. Follow these instructions at home: Eating and drinking  Eat a diet that includes fresh fruits and vegetables, whole grains, lean protein, and low-fat dairy products.  Take vitamin and mineral supplements as recommended by your health care provider.  Do not drink alcohol if: ? Your health care provider tells you not to drink. ? You are pregnant, may be pregnant, or are planning to become pregnant.  If you drink alcohol: ? Limit how much you have to 0-1 drink a day. ? Be aware of how much alcohol is in your drink. In the [place] one drink equals one 12 oz bottle of beer (355 mL), one 5 oz glass of wine (148 mL), or one 1 oz glass of hard liquor (44 mL).   Lifestyle  Take daily care of your teeth and gums. Brush your teeth every morning and night with fluoride toothpaste. Floss one time each day.  Stay active. Exercise for at least 30 minutes 5 or more days each week.  Do not use any products that contain nicotine or tobacco, such as cigarettes, e-cigarettes, and chewing tobacco. If you need help quitting, ask your health care provider.  Do not use drugs.  If you are sexually active, practice safe sex. Use a condom or other form of protection to prevent STIs (sexually transmitted infections).  If you do not wish to become pregnant, use a form of birth control. If you plan to become pregnant, see your health care provider for a prepregnancy visit.  If told by your health care provider, take low-dose aspirin daily starting at age 45.  Find healthy ways to cope with stress, such as: ? Meditation, yoga, or listening to music. ? Journaling. ? Talking to a trusted person. ? Spending time with friends and family. Safety  Always  wear your seat belt while driving or riding in a vehicle.  Do not drive: ? If you have been drinking alcohol. Do not ride with someone who has been drinking. ? When you are tired or distracted. ? While texting.  Wear a helmet and other protective equipment during sports activities.  If you have firearms in your house, make sure you follow all gun safety procedures. What's next?  Visit your health care provider once a year for an annual wellness visit.  Ask your health care provider how often you should have your eyes and teeth checked.  Stay up to date on all vaccines. This information is not intended to replace advice given to you by your health care provider. Make sure you discuss any questions you have with your health care provider. Document Revised: 05/02/2020 Document Reviewed: 04/09/2018 Elsevier Patient  Education  2021 [employer].

## 2020-10-05 NOTE — Assessment & Plan Note (Signed)
Stable.  Has followed with allergy in the past.  Uses over-the-counter meds as needed.

## 2020-10-05 NOTE — Progress Notes (Signed)
Chief Complaint:  Diana Andersen is a 45 y.o. female who presents today for her annual comprehensive physical exam.    Assessment/Plan:  Chronic Problems Addressed Today: Allergic rhinitis Stable.  Has followed with allergy in the past.  Uses over-the-counter meds as needed.  Hypothyroidism Managed by OB/GYN.  She is on Synthroid 50 mcg daily.  Preventative Healthcare: Follows with OB/GYN for Pap and mammogram.  Will place order for colonoscopy.  Gets labs done through OB/GYN.  Up-to-date on vaccines.  Patient Counseling(The following topics were reviewed and/or handout was given):  -Nutrition: Stressed importance of moderation in sodium/caffeine intake, saturated fat and cholesterol, caloric balance, sufficient intake of fresh fruits, vegetables, and fiber.  -Stressed the importance of regular exercise.   -Substance Abuse: Discussed cessation/primary prevention of tobacco, alcohol, or other drug use; driving or other dangerous activities under the influence; availability of treatment for abuse.   -Injury prevention: Discussed safety belts, safety helmets, smoke detector, smoking near bedding or upholstery.   -Sexuality: Discussed sexually transmitted diseases, partner selection, use of condoms, avoidance of unintended pregnancy and contraceptive alternatives.   -Dental health: Discussed importance of regular tooth brushing, flossing, and dental visits.  -Health maintenance and immunizations reviewed. Please refer to Health maintenance section.  Return to care in 1 year for next preventative visit.     Subjective:  HPI:  She has no acute complaints today.   Lifestyle Diet: Plenty of fruits and vegetables.  Exercise: Walking 3 days per week on treadmill. Likes tennis.   Depression screen PHQ 2/9 10/05/2020  Decreased Interest 0  Down, Depressed, Hopeless 0  PHQ - 2 Score 0    Health Maintenance Due  Topic Date Due  . Hepatitis C Screening  Never done  . URINE  MICROALBUMIN  Never done  . COLONOSCOPY (Pts 45-46yr Insurance coverage will need to be confirmed)  Never done     ROS: Per HPI, otherwise a complete review of systems was negative.   PMH:  The following were reviewed and entered/updated in epic: Past Medical History:  Diagnosis Date  . Diabetes mellitus without complication ([facility])    gestational  . H/O multiple allergies   . PONV (postoperative nausea and vomiting)   . Thyroid disease    Patient Active Problem List   Diagnosis Date Noted  . Hypothyroidism 10/05/2020  . Allergic rhinitis 10/05/2020   Past Surgical History:  Procedure Laterality Date  . CESAREAN SECTION    . CESAREAN SECTION WITH BILATERAL TUBAL LIGATION Bilateral 05/18/2015   Procedure: Repeat CESAREAN SECTION WITH BILATERAL TUBAL LIGATION;  Surgeon: RBrien Few MD;  [facility];  Service: Obstetrics;  Laterality: Bilateral;  EDD: 05/20/15 TGaylord ShihRNFA confirmed on 05/15/15  . VAGINAL DELIVERY      Family History  Problem Relation Age of Onset  . Diabetes Mother   . Hypertension Mother   . Diabetes Father   . Hypertension Father   . Breast cancer Neg Hx     Medications- reviewed and updated Current Outpatient Medications  Medication Sig Dispense Refill  . Levothyroxine Sodium 50 MCG CAPS Take by mouth daily before breakfast.    . aspirin 81 MG tablet Take 81 mg by mouth daily.    . calcium carbonate (OSCAL) 1500 (600 Ca) MG TABS tablet Take by mouth 2 (two) times daily with a meal.    . Multiple Vitamin (MULTIVITAMIN) tablet Take 1 tablet by mouth daily.     No current facility-administered medications for this visit.  Allergies-reviewed and updated No Known Allergies  Social History   Socioeconomic History  . Marital status: Married    Spouse name: Not on file  . Number of children: Not on file  . Years of education: Not on file  . Highest education level: Not on file  Occupational History  . Not on file  Tobacco Use  .  Smoking status: Never Smoker  . Smokeless tobacco: Never Used  Substance and Sexual Activity  . Alcohol use: Yes    Comment: None since LMP  . Drug use: No  . Sexual activity: Not on file  Other Topics Concern  . Not on file  Social History Narrative  . Not on file   Social Determinants of Health   Financial Resource Strain: Not on file  Food Insecurity: Not on file  Transportation Needs: Not on file  Physical Activity: Not on file  Stress: Not on file  Social Connections: Not on file        Objective:  Physical Exam: BP (!) 135/95   Pulse 64   Temp 98.1 F (36.7 C) (Temporal)   Ht 5' 3"  (1.6 m)   Wt 116 lb 6.4 oz (52.8 kg)   LMP 08/12/2020   SpO2 99%   BMI 20.62 kg/m   Body mass index is 20.62 kg/m. Wt Readings from Last 3 Encounters:  10/05/20 116 lb 6.4 oz (52.8 kg)  08/09/18 111 lb 9.6 oz (50.6 kg)  06/18/16 154 lb 9.6 oz (70.1 kg)   Gen: NAD, resting comfortably HEENT: TMs normal bilaterally. OP clear. No thyromegaly noted.  CV: RRR with no murmurs appreciated Pulm: NWOB, CTAB with no crackles, wheezes, or rhonchi GI: Normal bowel sounds present. Soft, Nontender, Nondistended. MSK: no edema, cyanosis, or clubbing noted Skin: warm, dry Neuro: CN2-12 grossly intact. Strength 5/5 in upper and lower extremities. Reflexes symmetric and intact bilaterally.  Psych: Normal affect and thought content     Caleb M. Jerline Pain, MD 10/05/2020 10:25 AM

## 2020-10-23 ENCOUNTER — Ambulatory Visit
Admission: RE | Admit: 2020-10-23 | Discharge: 2020-10-23 | Disposition: A | Discharge status: Home / Self Care | Payer: 59 | Source: Ambulatory Visit

## 2020-10-23 ENCOUNTER — Other Ambulatory Visit: Payer: Self-pay

## 2020-10-23 DIAGNOSIS — Z1231 Encounter for screening mammogram for malignant neoplasm of breast: Secondary | ICD-10-CM

## 2020-10-27 DIAGNOSIS — H52223 Regular astigmatism, bilateral: Secondary | ICD-10-CM | POA: Diagnosis not present

## 2020-10-27 DIAGNOSIS — H524 Presbyopia: Secondary | ICD-10-CM | POA: Diagnosis not present

## 2020-10-27 DIAGNOSIS — H5213 Myopia, bilateral: Secondary | ICD-10-CM | POA: Diagnosis not present

## 2020-12-22 ENCOUNTER — Other Ambulatory Visit (HOSPITAL_COMMUNITY): Payer: Self-pay

## 2020-12-22 DIAGNOSIS — Z01419 Encounter for gynecological examination (general) (routine) without abnormal findings: Secondary | ICD-10-CM | POA: Diagnosis not present

## 2020-12-22 DIAGNOSIS — E039 Hypothyroidism, unspecified: Secondary | ICD-10-CM | POA: Diagnosis not present

## 2020-12-22 DIAGNOSIS — Z01411 Encounter for gynecological examination (general) (routine) with abnormal findings: Secondary | ICD-10-CM | POA: Diagnosis not present

## 2020-12-22 DIAGNOSIS — Z6820 Body mass index (BMI) 20.0-20.9, adult: Secondary | ICD-10-CM | POA: Diagnosis not present

## 2020-12-22 DIAGNOSIS — Z113 Encounter for screening for infections with a predominantly sexual mode of transmission: Secondary | ICD-10-CM | POA: Diagnosis not present

## 2020-12-22 DIAGNOSIS — Z124 Encounter for screening for malignant neoplasm of cervix: Secondary | ICD-10-CM | POA: Diagnosis not present

## 2020-12-22 MED ORDER — LEVOTHYROXINE SODIUM 50 MCG PO TABS
50.0000 ug | ORAL_TABLET | Freq: Every day | ORAL | 3 refills | Status: DC
  Filled 2020-12-22: qty 90, 90d supply, fill #0
  Filled 2021-03-16: qty 90, 90d supply, fill #1
  Filled 2021-06-19: qty 90, 90d supply, fill #2
  Filled 2021-09-12: qty 90, 90d supply, fill #3

## 2020-12-26 ENCOUNTER — Encounter: Payer: Self-pay | Admitting: Family Medicine

## 2020-12-27 ENCOUNTER — Other Ambulatory Visit: Payer: Self-pay | Admitting: *Deleted

## 2020-12-27 DIAGNOSIS — Z1211 Encounter for screening for malignant neoplasm of colon: Secondary | ICD-10-CM

## 2020-12-27 NOTE — Telephone Encounter (Signed)
Order placed and faxed

## 2020-12-27 NOTE — Telephone Encounter (Signed)
See note

## 2021-01-23 DIAGNOSIS — Z1211 Encounter for screening for malignant neoplasm of colon: Secondary | ICD-10-CM | POA: Diagnosis not present

## 2021-01-23 LAB — COLOGUARD: Cologuard: NEGATIVE

## 2021-01-31 ENCOUNTER — Encounter: Payer: Self-pay | Admitting: Family Medicine

## 2021-02-13 ENCOUNTER — Encounter: Payer: 59 | Admitting: Internal Medicine

## 2021-02-15 ENCOUNTER — Encounter: Payer: 59 | Admitting: Internal Medicine

## 2021-02-28 DIAGNOSIS — J3089 Other allergic rhinitis: Secondary | ICD-10-CM | POA: Diagnosis not present

## 2021-02-28 DIAGNOSIS — J301 Allergic rhinitis due to pollen: Secondary | ICD-10-CM | POA: Diagnosis not present

## 2021-02-28 DIAGNOSIS — H1045 Other chronic allergic conjunctivitis: Secondary | ICD-10-CM | POA: Diagnosis not present

## 2021-02-28 DIAGNOSIS — J3081 Allergic rhinitis due to animal (cat) (dog) hair and dander: Secondary | ICD-10-CM | POA: Diagnosis not present

## 2021-03-16 ENCOUNTER — Other Ambulatory Visit (HOSPITAL_COMMUNITY): Payer: Self-pay

## 2021-06-19 ENCOUNTER — Other Ambulatory Visit (HOSPITAL_COMMUNITY): Payer: Self-pay

## 2021-09-11 ENCOUNTER — Other Ambulatory Visit (HOSPITAL_COMMUNITY): Payer: Self-pay

## 2021-09-11 MED ORDER — CARESTART COVID-19 HOME TEST VI KIT
PACK | 0 refills | Status: AC
  Filled 2021-09-11: qty 4, 4d supply, fill #0

## 2021-09-12 ENCOUNTER — Other Ambulatory Visit (HOSPITAL_COMMUNITY): Payer: Self-pay

## 2021-09-20 ENCOUNTER — Other Ambulatory Visit: Payer: Self-pay | Admitting: Obstetrics and Gynecology

## 2021-09-20 DIAGNOSIS — Z1231 Encounter for screening mammogram for malignant neoplasm of breast: Secondary | ICD-10-CM

## 2021-10-25 ENCOUNTER — Ambulatory Visit
Admission: RE | Admit: 2021-10-25 | Discharge: 2021-10-25 | Disposition: A | Discharge status: Home / Self Care | Payer: 59 | Source: Ambulatory Visit | Attending: Obstetrics and Gynecology | Admitting: Obstetrics and Gynecology

## 2021-10-25 DIAGNOSIS — Z1231 Encounter for screening mammogram for malignant neoplasm of breast: Secondary | ICD-10-CM | POA: Diagnosis not present

## 2021-11-06 DIAGNOSIS — H524 Presbyopia: Secondary | ICD-10-CM | POA: Diagnosis not present

## 2021-11-06 DIAGNOSIS — H5213 Myopia, bilateral: Secondary | ICD-10-CM | POA: Diagnosis not present

## 2021-11-06 DIAGNOSIS — H52223 Regular astigmatism, bilateral: Secondary | ICD-10-CM | POA: Diagnosis not present

## 2021-12-14 ENCOUNTER — Other Ambulatory Visit (HOSPITAL_COMMUNITY): Payer: Self-pay

## 2021-12-14 MED ORDER — LEVOTHYROXINE SODIUM 50 MCG PO TABS
50.0000 ug | ORAL_TABLET | Freq: Every day | ORAL | 0 refills | Status: DC
Start: 2021-12-14 — End: 2021-12-25
  Filled 2021-12-14: qty 90, 90d supply, fill #0

## 2021-12-25 ENCOUNTER — Other Ambulatory Visit (HOSPITAL_COMMUNITY): Payer: Self-pay

## 2021-12-25 DIAGNOSIS — Z113 Encounter for screening for infections with a predominantly sexual mode of transmission: Secondary | ICD-10-CM | POA: Diagnosis not present

## 2021-12-25 DIAGNOSIS — Z0142 Encounter for cervical smear to confirm findings of recent normal smear following initial abnormal smear: Secondary | ICD-10-CM | POA: Diagnosis not present

## 2021-12-25 DIAGNOSIS — Z01411 Encounter for gynecological examination (general) (routine) with abnormal findings: Secondary | ICD-10-CM | POA: Diagnosis not present

## 2021-12-25 DIAGNOSIS — Z6821 Body mass index (BMI) 21.0-21.9, adult: Secondary | ICD-10-CM | POA: Diagnosis not present

## 2021-12-25 DIAGNOSIS — Z01419 Encounter for gynecological examination (general) (routine) without abnormal findings: Secondary | ICD-10-CM | POA: Diagnosis not present

## 2021-12-25 DIAGNOSIS — E039 Hypothyroidism, unspecified: Secondary | ICD-10-CM | POA: Diagnosis not present

## 2021-12-25 DIAGNOSIS — Z124 Encounter for screening for malignant neoplasm of cervix: Secondary | ICD-10-CM | POA: Diagnosis not present

## 2021-12-25 MED ORDER — LEVOTHYROXINE SODIUM 50 MCG PO TABS
50.0000 ug | ORAL_TABLET | Freq: Every day | ORAL | 3 refills | Status: DC
  Filled 2021-12-25: qty 90, 90d supply, fill #0

## 2021-12-26 ENCOUNTER — Other Ambulatory Visit (HOSPITAL_COMMUNITY): Payer: Self-pay

## 2021-12-26 MED ORDER — LEVOTHYROXINE SODIUM 75 MCG PO TABS
ORAL_TABLET | ORAL | 1 refills | Status: AC
  Filled 2021-12-26: qty 30, 30d supply, fill #0
  Filled 2022-01-23: qty 30, 30d supply, fill #1

## 2022-01-23 ENCOUNTER — Other Ambulatory Visit (HOSPITAL_COMMUNITY): Payer: Self-pay

## 2022-02-13 DIAGNOSIS — E039 Hypothyroidism, unspecified: Secondary | ICD-10-CM | POA: Diagnosis not present

## 2022-02-25 ENCOUNTER — Ambulatory Visit: Payer: 59 | Admitting: Physician Assistant

## 2022-02-25 ENCOUNTER — Encounter: Payer: Self-pay | Admitting: Physician Assistant

## 2022-02-25 VITALS — BP 120/78 | HR 64 | Temp 98.2°F | Ht 63.0 in | Wt 116.4 lb

## 2022-02-25 DIAGNOSIS — S41112A Laceration without foreign body of left upper arm, initial encounter: Secondary | ICD-10-CM | POA: Diagnosis not present

## 2022-02-25 NOTE — Patient Instructions (Signed)
It was great to see you!  Apply xeroform gauze to clean arm daily Cover with bandage Use plain soap and water  May apply the bacitracin if needed  May take over the counter claritin/allegra/zyrtec (generic is fine) -- for the itching  Keep me posted  Take care,  Jarold Motto PA-C

## 2022-02-25 NOTE — Progress Notes (Signed)
Diana Andersen is a 46 y.o. female here for a arm pain.    History of Present Illness:   Chief Complaint  Patient presents with   Arm Pain    Pt c/o left arm pain, happened last Wed was in the garden hit a fence stake.    HPI  Left Arm Pain  Patient complain of left arm pain that has been onset for 6 days. States she was in her garden and accidentally hit her arm with fence stake. Has been using hydrogen peroxide and neosporin. Has had some itching. No other treatment tried. Last Tetanus shot was in 2016. No fever or chills. No numbness or tingling. Denies any other concern or worsening sx.   Past Medical History:  Diagnosis Date   Diabetes mellitus without complication ([place])    gestational   H/O multiple allergies    PONV (postoperative nausea and vomiting)    Thyroid disease      Social History   Tobacco Use   Smoking status: Never   Smokeless tobacco: Never  Substance Use Topics   Alcohol use: Yes    Comment: None since LMP   Drug use: No    Past Surgical History:  Procedure Laterality Date   CESAREAN SECTION     CESAREAN SECTION WITH BILATERAL TUBAL LIGATION Bilateral 05/18/2015   Procedure: Repeat CESAREAN SECTION WITH BILATERAL TUBAL LIGATION;  Surgeon: Brien Few, MD;  Location: [facility];  Service: Obstetrics;  Laterality: Bilateral;  EDD: 05/20/15 Gaylord Shih RNFA confirmed on 05/15/15   VAGINAL DELIVERY      Family History  Problem Relation Age of Onset   Diabetes Mother    Hypertension Mother    Diabetes Father    Hypertension Father    Breast cancer Neg Hx     No Known Allergies  Current Medications:   Current Outpatient Medications:    calcium carbonate (OSCAL) 1500 (600 Ca) MG TABS tablet, Take by mouth 2 (two) times daily with a meal., Disp: , Rfl:    COVID-19 At Home Antigen Test (CARESTART COVID-19 HOME TEST) KIT, Use as directed within package instructions., Disp: 4 each, Rfl: 0   levothyroxine (SYNTHROID) 75 MCG tablet, Take 1 tablet by  mouth daily., Disp: 30 tablet, Rfl: 1   Multiple Vitamin (MULTIVITAMIN) tablet, Take 1 tablet by mouth daily., Disp: , Rfl:    albuterol (VENTOLIN HFA) 108 (90 Base) MCG/ACT inhaler, INHALE 2 PUFFS BY MOUTH AS NEEDED EVERY 4 HOURS FOR COUGH/WHEEZE, Disp: 8.5 g, Rfl: 0   Review of Systems:   ROS Negative unless otherwise specified per HPI.   Vitals:   Vitals:   02/25/22 1543  BP: 120/78  Pulse: 64  Temp: 98.2 F (36.8 C)  TempSrc: Temporal  SpO2: 96%  Weight: 116 lb 6.1 oz (52.8 kg)  Height: '5\' 3"'  (1.6 m)     Body mass index is 20.62 kg/m.  Physical Exam:   Physical Exam Constitutional:      Appearance: Normal appearance. She is well-developed.  HENT:     Head: Normocephalic and atraumatic.  Eyes:     General: Lids are normal.     Extraocular Movements: Extraocular movements intact.     Conjunctiva/sclera: Conjunctivae normal.  Pulmonary:     Effort: Pulmonary effort is normal.  Musculoskeletal:        General: Normal range of motion.     Cervical back: Normal range of motion and neck supple.  Skin:    General: Skin is warm and  dry.     Comments: L forearm with well-approximately erythematous laceration approximately 3 inches  Neurological:     Mental Status: She is alert and oriented to person, place, and time.  Psychiatric:        Attention and Perception: Attention and perception normal.        Mood and Affect: Mood normal.        Behavior: Behavior normal.        Thought Content: Thought content normal.        Judgment: Judgment normal.     Assessment and Plan:   Laceration of left upper extremity, initial encounter New Does not appear infected Suspect delayed healing  Discussed proper wound care with keeping area moist with xeroform dressing, covered and clean with soap and water Consider oral antihistamine for itching Avoid neosporin further hydrogen peroxide use If new/worsening symptoms, she was advised to reach out to [place]  [employer]  as a scribe for [employer], PA.,have documented all relevant documentation on the behalf of Inda Coke, PA,as directed by  Inda Coke, PA while in the presence of Inda Coke, [place].   I, Inda Coke, [place], have reviewed all documentation for this visit. The documentation on 02/25/22 for the exam, diagnosis, procedures, and orders are all accurate and complete.   Inda Coke, PA-C

## 2022-03-01 ENCOUNTER — Encounter: Payer: Self-pay | Admitting: Physician Assistant

## 2022-03-01 ENCOUNTER — Other Ambulatory Visit (HOSPITAL_COMMUNITY): Payer: Self-pay

## 2022-03-01 MED ORDER — LEVOTHYROXINE SODIUM 25 MCG PO TABS
ORAL_TABLET | ORAL | 0 refills | Status: AC
  Filled 2022-03-01: qty 90, 90d supply, fill #0

## 2022-03-01 NOTE — Telephone Encounter (Signed)
Looks like she saw Lelon Mast for this. Would ideally see her back in the office for an office visit if symptoms are not improving however if we are not able to do this would be reasonable to send in doxycycline 100 mg twice daily for 7 days. Please send in if she feels like she is getting an infection.  Diana Andersen. Jimmey Ralph, MD 03/01/2022 10:48 AM

## 2022-03-02 ENCOUNTER — Other Ambulatory Visit (HOSPITAL_COMMUNITY): Payer: Self-pay

## 2022-03-05 ENCOUNTER — Other Ambulatory Visit (HOSPITAL_COMMUNITY): Payer: Self-pay

## 2022-03-05 ENCOUNTER — Encounter (HOSPITAL_COMMUNITY): Payer: Self-pay | Admitting: Emergency Medicine

## 2022-03-05 ENCOUNTER — Ambulatory Visit (HOSPITAL_COMMUNITY)
Admission: EM | Admit: 2022-03-05 | Discharge: 2022-03-05 | Disposition: A | Discharge status: Home / Self Care | Payer: 59 | Attending: Family Medicine | Admitting: Family Medicine

## 2022-03-05 ENCOUNTER — Other Ambulatory Visit: Payer: Self-pay

## 2022-03-05 DIAGNOSIS — T50905A Adverse effect of unspecified drugs, medicaments and biological substances, initial encounter: Secondary | ICD-10-CM

## 2022-03-05 DIAGNOSIS — R21 Rash and other nonspecific skin eruption: Secondary | ICD-10-CM | POA: Diagnosis not present

## 2022-03-05 MED ORDER — CLOBETASOL PROPIONATE 0.05 % EX CREA
1.0000 | TOPICAL_CREAM | Freq: Two times a day (BID) | CUTANEOUS | 0 refills | Status: AC
Start: 2022-03-05 — End: ?
  Filled 2022-03-05: qty 30, 15d supply, fill #0

## 2022-03-05 NOTE — ED Provider Notes (Signed)
  [facility]   [id number] 03/05/22 Arrival Time: 0949  ASSESSMENT & PLAN:  1. Adverse reaction to over-the-counter medication, initial encounter   2. Rash    No signs of infection. Has stopped Neosporin. Begin: Meds ordered this encounter  Medications   clobetasol cream (TEMOVATE) 0.05 %    Sig: Apply 1 Application topically 2 (two) times daily.    Dispense:  30 g    Refill:  0    Will follow up with PCP or here if worsening or failing to improve as anticipated. Reviewed expectations re: course of current medical issues. Questions answered. Outlined signs and symptoms indicating need for more acute intervention. Patient verbalized understanding. After Visit Summary given.   SUBJECTIVE:  Diana Andersen is a 45 y.o. female who presents with a skin complaint. L forearm; cut last week. Has been using topical Neosporin. Now area with rash and itching. Afebrile.   OBJECTIVE: Vitals:   03/05/22 1010 03/05/22 1014  BP: (!) 147/97 (!) 145/94  Pulse: 60   Resp: 16   Temp: 98 F (36.7 C)   TempSrc: Oral   SpO2: 100%     General appearance: alert; no distress L forearm: warm and dry; approx 3 cm linear healing cut with surrounding erythematous papular rash Psychological: alert and cooperative; normal mood and affect  No Known Allergies  Past Medical History:  Diagnosis Date   Diabetes mellitus without complication ([facility])    gestational   H/O multiple allergies    PONV (postoperative nausea and vomiting)    Thyroid disease    Social History   Socioeconomic History   Marital status: Married    Spouse name: Not on file   Number of children: Not on file   Years of education: Not on file   Highest education level: Not on file  Occupational History   Not on file  Tobacco Use   Smoking status: Never   Smokeless tobacco: Never  Vaping Use   Vaping Use: Never used  Substance and Sexual Activity   Alcohol use: Yes    Comment: None since LMP   Drug use: No    Sexual activity: Not on file  Other Topics Concern   Not on file  Social History Narrative   Not on file   Social Determinants of Health   Financial Resource Strain: Not on file  Food Insecurity: Not on file  Transportation Needs: Not on file  Physical Activity: Not on file  Stress: Not on file  Social Connections: Not on file  Intimate Partner Violence: Not on file   Family History  Problem Relation Age of Onset   Diabetes Mother    Hypertension Mother    Diabetes Father    Hypertension Father    Breast cancer Neg Hx    Past Surgical History:  Procedure Laterality Date   CESAREAN SECTION     CESAREAN SECTION WITH BILATERAL TUBAL LIGATION Bilateral 05/18/2015   Procedure: Repeat CESAREAN SECTION WITH BILATERAL TUBAL LIGATION;  Surgeon: Olivia Mackie, MD;  Location: [facility];  Service: Obstetrics;  Laterality: Bilateral;  EDD: 05/20/15 Karmen Stabs RNFA confirmed on 05/15/15   VAGINAL DELIVERY        Mardella Layman, MD 03/05/22 1051

## 2022-03-05 NOTE — ED Triage Notes (Signed)
2 weeks ago was working in garden.  Patient received a cut on left forearm,  patient used neosporin on wound.  Did see a provider at [facility].  Xeoform guaze and band was recommended.  Patient reports area itches terrible, scar is red and multiple small bumps around area

## 2022-03-05 NOTE — Telephone Encounter (Signed)
Spoke to pt , apologized for not getting back to you sooner. Dr. Jimmey Ralph said would ideally see her back in the office for an office visit if symptoms are not improving however if we are not able to do this would be reasonable to send in doxycycline 100 mg twice daily for 7 days. Please send in if she feels like she is getting an infection. Pt verbalized understanding and said she went to Urgent Care today and was given some topical cream. Physician said she was having an allergic reaction to the Xerofom. Told her okay if arm does not improve please schedule an appt. Pt verbalized understanding.

## 2022-04-12 ENCOUNTER — Other Ambulatory Visit (HOSPITAL_COMMUNITY): Payer: Self-pay

## 2022-04-12 DIAGNOSIS — H1045 Other chronic allergic conjunctivitis: Secondary | ICD-10-CM | POA: Diagnosis not present

## 2022-04-12 DIAGNOSIS — J3081 Allergic rhinitis due to animal (cat) (dog) hair and dander: Secondary | ICD-10-CM | POA: Diagnosis not present

## 2022-04-12 DIAGNOSIS — R062 Wheezing: Secondary | ICD-10-CM | POA: Diagnosis not present

## 2022-04-12 DIAGNOSIS — J301 Allergic rhinitis due to pollen: Secondary | ICD-10-CM | POA: Diagnosis not present

## 2022-04-12 DIAGNOSIS — J3089 Other allergic rhinitis: Secondary | ICD-10-CM | POA: Diagnosis not present

## 2022-04-12 MED ORDER — ALBUTEROL SULFATE HFA 108 (90 BASE) MCG/ACT IN AERS
INHALATION_SPRAY | RESPIRATORY_TRACT | 0 refills | Status: AC
Start: 2022-04-12 — End: ?
  Filled 2022-04-12: qty 6.7, 30d supply, fill #0

## 2022-04-19 ENCOUNTER — Other Ambulatory Visit (HOSPITAL_COMMUNITY): Payer: Self-pay

## 2022-05-06 ENCOUNTER — Encounter: Payer: Self-pay | Admitting: *Deleted

## 2022-05-22 ENCOUNTER — Other Ambulatory Visit (HOSPITAL_COMMUNITY): Payer: Self-pay

## 2022-05-22 MED ORDER — LEVOTHYROXINE SODIUM 75 MCG PO TABS
75.0000 ug | ORAL_TABLET | Freq: Every day | ORAL | 2 refills | Status: DC
Start: 2022-05-21 — End: 2023-01-02
  Filled 2022-05-22: qty 90, 90d supply, fill #0
  Filled 2022-08-09: qty 90, 90d supply, fill #1
  Filled 2022-11-14: qty 90, 90d supply, fill #2

## 2022-05-23 ENCOUNTER — Other Ambulatory Visit (HOSPITAL_COMMUNITY): Payer: Self-pay

## 2022-07-25 ENCOUNTER — Encounter: Payer: Self-pay | Admitting: *Deleted

## 2022-08-07 DIAGNOSIS — L820 Inflamed seborrheic keratosis: Secondary | ICD-10-CM | POA: Diagnosis not present

## 2022-08-09 ENCOUNTER — Other Ambulatory Visit (HOSPITAL_COMMUNITY): Payer: Self-pay

## 2022-08-09 ENCOUNTER — Other Ambulatory Visit: Payer: Self-pay

## 2022-09-04 DIAGNOSIS — L820 Inflamed seborrheic keratosis: Secondary | ICD-10-CM | POA: Diagnosis not present

## 2022-10-02 ENCOUNTER — Other Ambulatory Visit: Payer: Self-pay | Admitting: Obstetrics and Gynecology

## 2022-10-02 DIAGNOSIS — Z1231 Encounter for screening mammogram for malignant neoplasm of breast: Secondary | ICD-10-CM

## 2022-11-14 ENCOUNTER — Other Ambulatory Visit (HOSPITAL_COMMUNITY): Payer: Self-pay

## 2022-11-18 ENCOUNTER — Ambulatory Visit
Admission: RE | Admit: 2022-11-18 | Discharge: 2022-11-18 | Disposition: A | Discharge status: Home / Self Care | Payer: 59 | Source: Ambulatory Visit | Attending: Obstetrics and Gynecology | Admitting: Obstetrics and Gynecology

## 2022-11-18 DIAGNOSIS — Z1231 Encounter for screening mammogram for malignant neoplasm of breast: Secondary | ICD-10-CM

## 2023-01-02 ENCOUNTER — Other Ambulatory Visit (HOSPITAL_COMMUNITY): Payer: Self-pay

## 2023-01-02 DIAGNOSIS — Z01411 Encounter for gynecological examination (general) (routine) with abnormal findings: Secondary | ICD-10-CM | POA: Diagnosis not present

## 2023-01-02 DIAGNOSIS — E039 Hypothyroidism, unspecified: Secondary | ICD-10-CM | POA: Diagnosis not present

## 2023-01-02 DIAGNOSIS — Z124 Encounter for screening for malignant neoplasm of cervix: Secondary | ICD-10-CM | POA: Diagnosis not present

## 2023-01-02 DIAGNOSIS — Z113 Encounter for screening for infections with a predominantly sexual mode of transmission: Secondary | ICD-10-CM | POA: Diagnosis not present

## 2023-01-02 DIAGNOSIS — N914 Secondary oligomenorrhea: Secondary | ICD-10-CM | POA: Diagnosis not present

## 2023-01-02 DIAGNOSIS — Z01419 Encounter for gynecological examination (general) (routine) without abnormal findings: Secondary | ICD-10-CM | POA: Diagnosis not present

## 2023-01-02 MED ORDER — LEVOTHYROXINE SODIUM 75 MCG PO TABS
75.0000 ug | ORAL_TABLET | Freq: Every day | ORAL | 3 refills | Status: DC
  Filled 2023-02-11 – 2023-05-05 (×2): qty 90, 90d supply, fill #0
  Filled 2023-08-08: qty 90, 90d supply, fill #1
  Filled 2023-11-08 (×2): qty 90, 90d supply, fill #2

## 2023-02-11 ENCOUNTER — Other Ambulatory Visit (HOSPITAL_COMMUNITY): Payer: Self-pay

## 2023-02-24 DIAGNOSIS — H5213 Myopia, bilateral: Secondary | ICD-10-CM | POA: Diagnosis not present

## 2023-02-24 DIAGNOSIS — H52223 Regular astigmatism, bilateral: Secondary | ICD-10-CM | POA: Diagnosis not present

## 2023-02-24 DIAGNOSIS — H524 Presbyopia: Secondary | ICD-10-CM | POA: Diagnosis not present

## 2023-05-05 ENCOUNTER — Other Ambulatory Visit (HOSPITAL_BASED_OUTPATIENT_CLINIC_OR_DEPARTMENT_OTHER): Payer: Self-pay

## 2023-05-05 ENCOUNTER — Other Ambulatory Visit (HOSPITAL_COMMUNITY): Payer: Self-pay

## 2023-08-08 ENCOUNTER — Other Ambulatory Visit: Payer: Self-pay

## 2023-09-08 ENCOUNTER — Other Ambulatory Visit: Payer: Self-pay | Admitting: Obstetrics and Gynecology

## 2023-09-08 DIAGNOSIS — Z1231 Encounter for screening mammogram for malignant neoplasm of breast: Secondary | ICD-10-CM

## 2023-09-28 IMAGING — MG MM DIGITAL SCREENING BILAT W/ TOMO AND CAD
8 series · 9 of 24 positions shown · non-contrast
Comparison: Previous exam(s).

CLINICAL DATA: Screening.

EXAM:
DIGITAL SCREENING BILATERAL MAMMOGRAM WITH TOMOSYNTHESIS AND CAD
TECHNIQUE: Bilateral screening digital craniocaudal and mediolateral oblique
mammograms were obtained. Bilateral screening digital breast
tomosynthesis was performed. The images were evaluated with
computer-aided detection.

[R CC synth-2D]
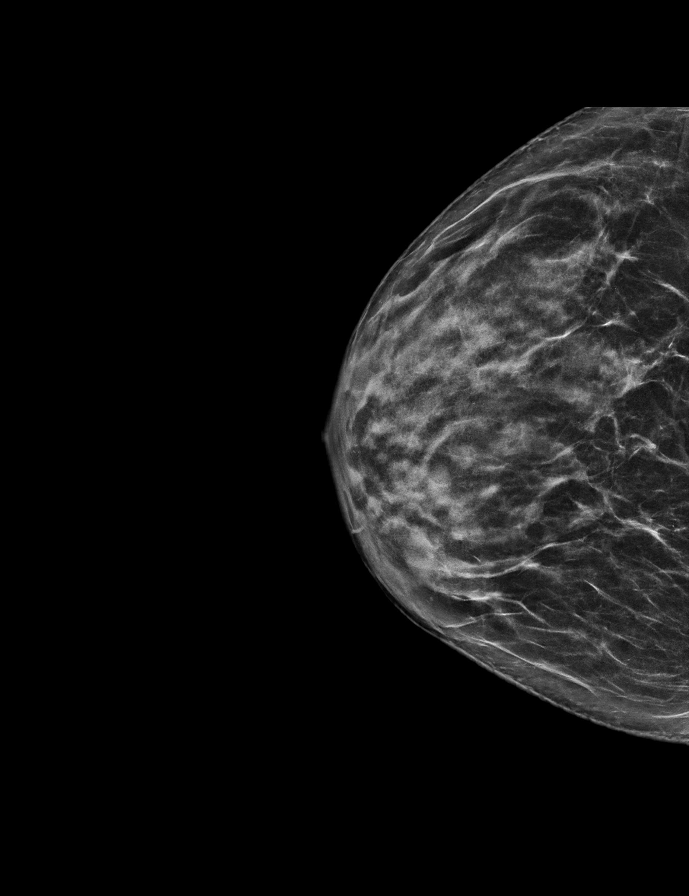

[L MLO synth-2D]
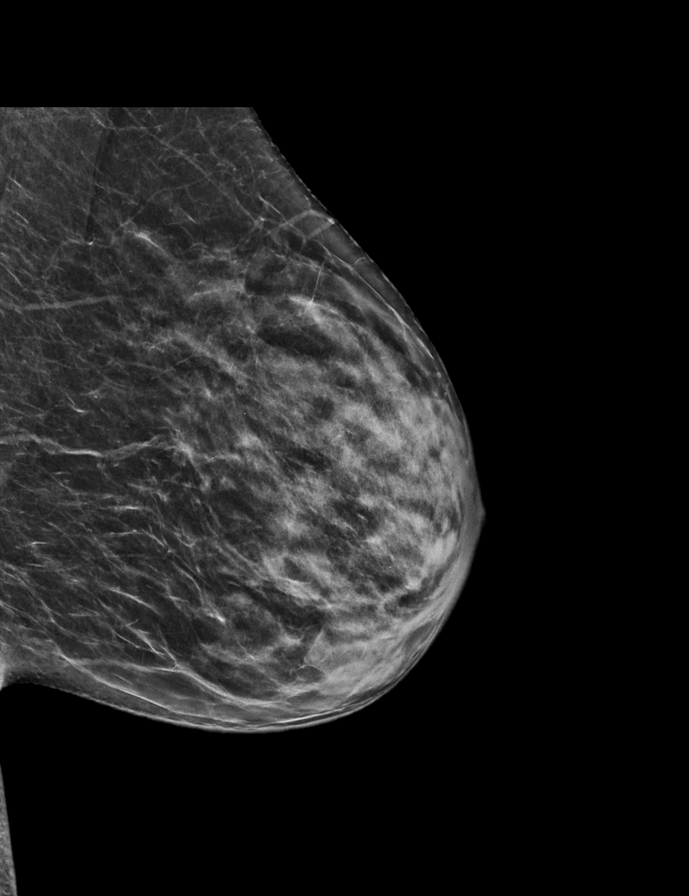

[L CC synth-2D]
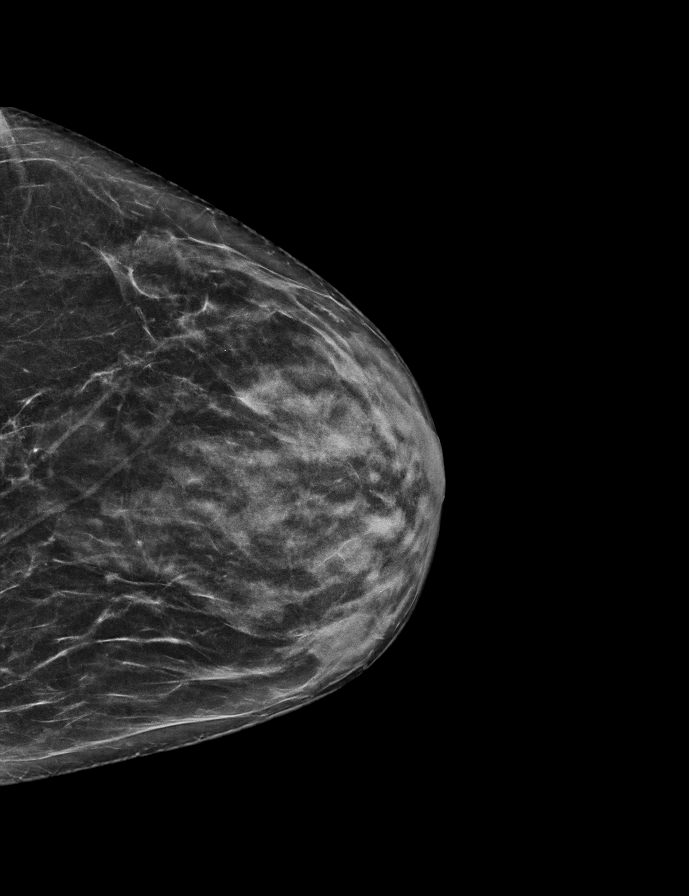

[R MLO synth-2D]
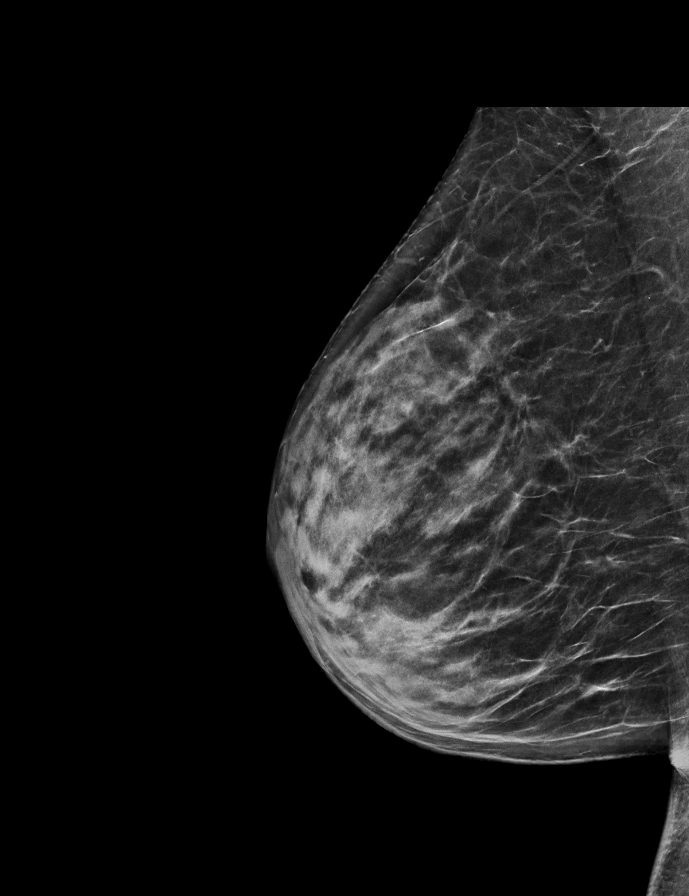

[L MLO tomo · 2 of 57 frames shown]
[frame 19/57]
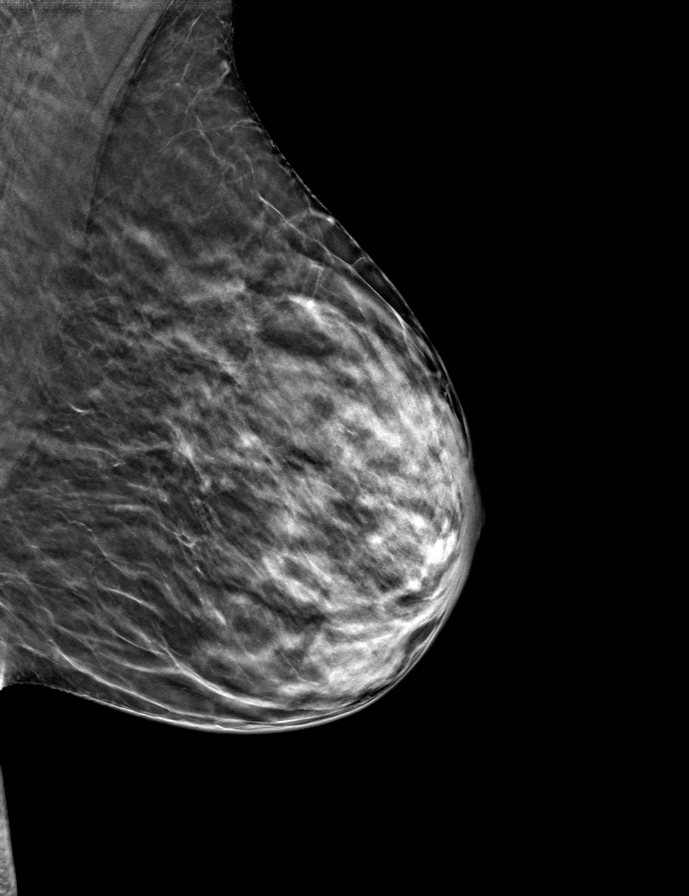
[frame 29/57]
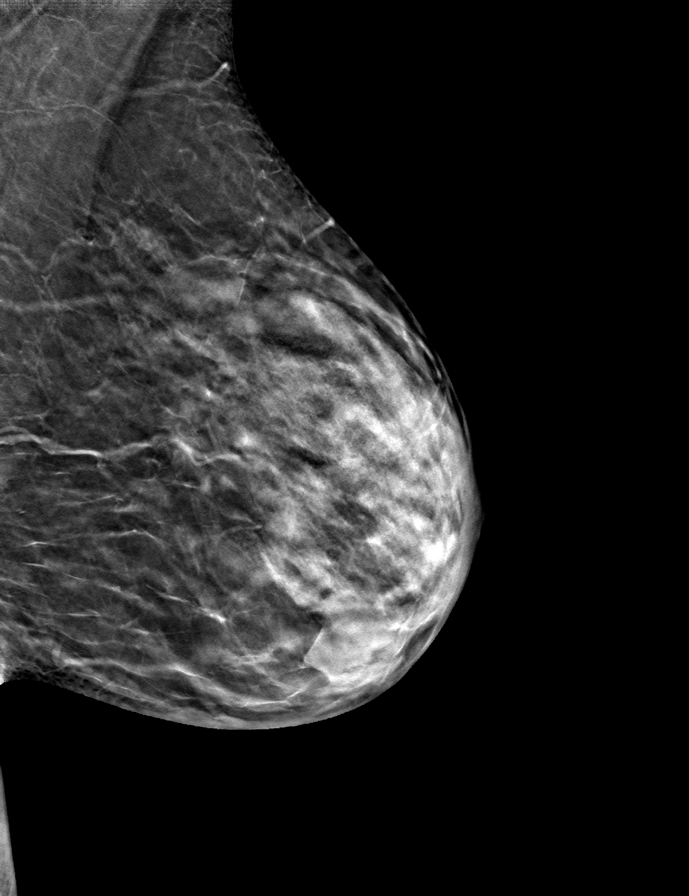

[L CC tomo · tomo slice 30/59.0]
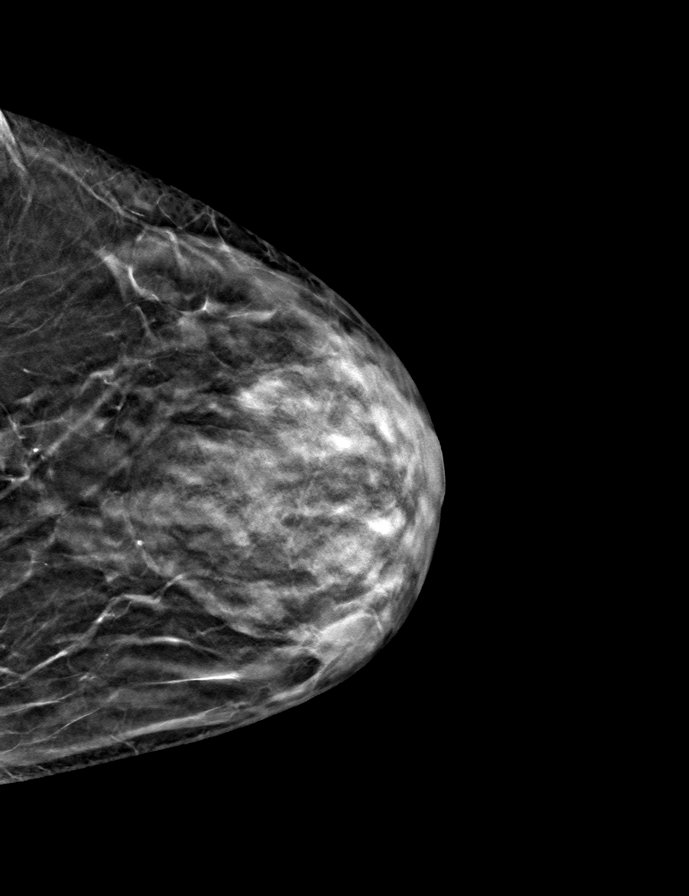

[R CC tomo · tomo slice 31/62.0]
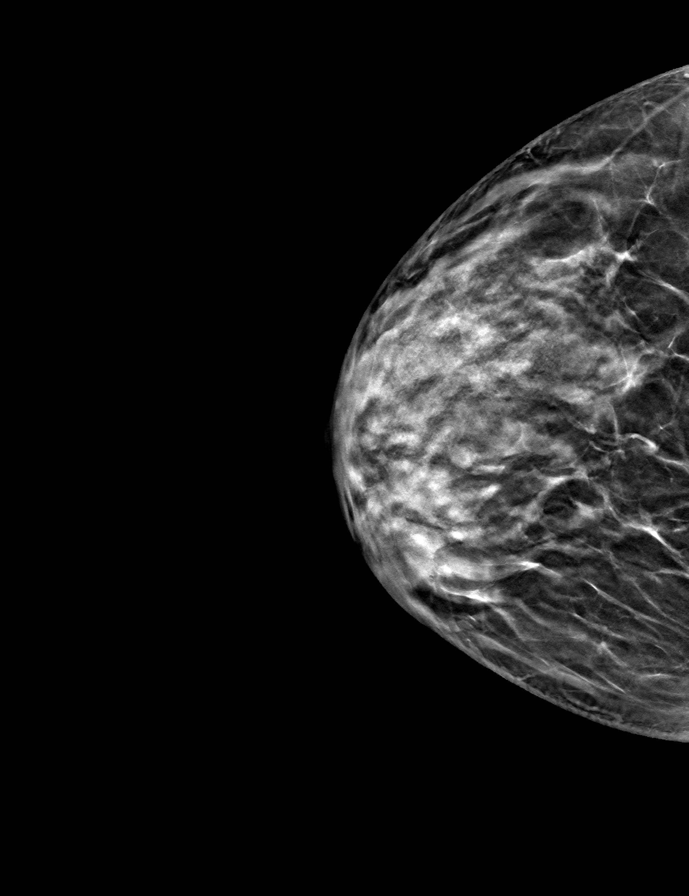

[R MLO tomo · tomo slice 32/63.0]
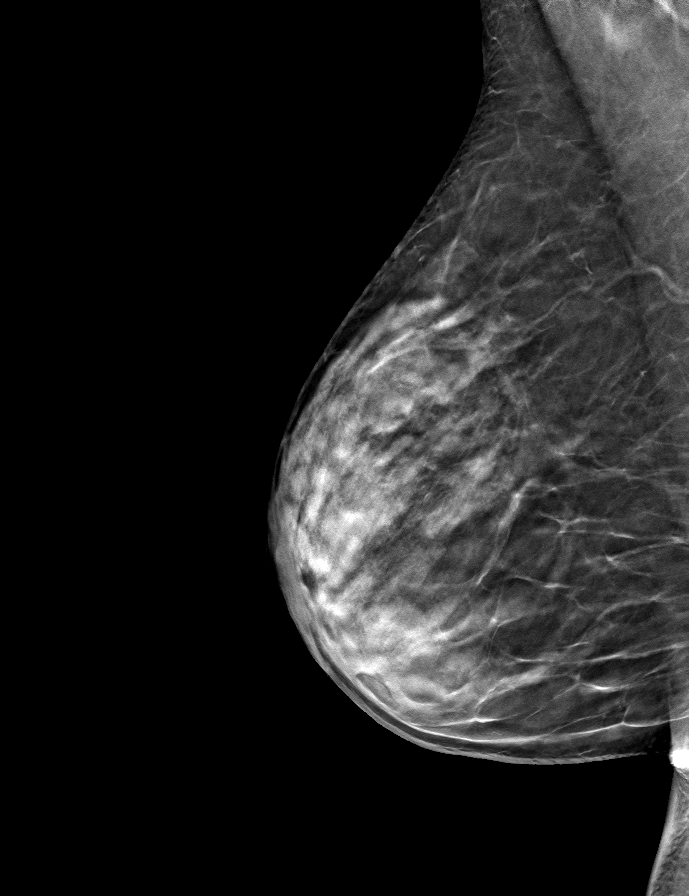

[9 of 24 positions shown; findings below may reference images not displayed]

ACR Breast Density Category c: The breast tissue is heterogeneously
dense, which may obscure small masses.
FINDINGS: There are no findings suspicious for malignancy.
IMPRESSION: No mammographic evidence of malignancy. A result letter of this
screening mammogram will be mailed directly to the patient.

RECOMMENDATION:
Screening mammogram in one year. (Code:Q3-W-BC3)

BI-RADS CATEGORY  1: Negative.

## 2023-10-22 ENCOUNTER — Other Ambulatory Visit (HOSPITAL_COMMUNITY): Payer: Self-pay

## 2023-10-22 ENCOUNTER — Other Ambulatory Visit (HOSPITAL_BASED_OUTPATIENT_CLINIC_OR_DEPARTMENT_OTHER): Payer: Self-pay

## 2023-10-22 DIAGNOSIS — L0100 Impetigo, unspecified: Secondary | ICD-10-CM | POA: Diagnosis not present

## 2023-10-22 DIAGNOSIS — I308 Other forms of acute pericarditis: Secondary | ICD-10-CM | POA: Diagnosis not present

## 2023-10-22 MED ORDER — MUPIROCIN 2 % EX OINT
1.0000 | TOPICAL_OINTMENT | Freq: Two times a day (BID) | CUTANEOUS | 3 refills | Status: AC | PRN
  Filled 2023-10-22: qty 22, 11d supply, fill #0

## 2023-10-22 MED ORDER — DOXYCYCLINE MONOHYDRATE 100 MG PO TABS
100.0000 mg | ORAL_TABLET | Freq: Two times a day (BID) | ORAL | 1 refills | Status: AC
  Filled 2023-10-22: qty 28, 14d supply, fill #0

## 2023-10-22 MED ORDER — BETAMETHASONE DIPROPIONATE 0.05 % EX CREA
1.0000 | TOPICAL_CREAM | Freq: Two times a day (BID) | CUTANEOUS | 3 refills | Status: AC | PRN
  Filled 2023-10-22: qty 45, 30d supply, fill #0
  Filled 2023-10-22: qty 50, 30d supply, fill #0

## 2023-11-08 ENCOUNTER — Other Ambulatory Visit (HOSPITAL_BASED_OUTPATIENT_CLINIC_OR_DEPARTMENT_OTHER): Payer: Self-pay

## 2023-11-10 ENCOUNTER — Other Ambulatory Visit (HOSPITAL_BASED_OUTPATIENT_CLINIC_OR_DEPARTMENT_OTHER): Payer: Self-pay

## 2023-11-19 ENCOUNTER — Ambulatory Visit
Admission: RE | Admit: 2023-11-19 | Discharge: 2023-11-19 | Disposition: A | Discharge status: Home / Self Care | Payer: 59 | Source: Ambulatory Visit | Attending: Obstetrics and Gynecology | Admitting: Obstetrics and Gynecology

## 2023-11-19 DIAGNOSIS — Z1231 Encounter for screening mammogram for malignant neoplasm of breast: Secondary | ICD-10-CM

## 2023-12-31 DIAGNOSIS — Z1331 Encounter for screening for depression: Secondary | ICD-10-CM | POA: Diagnosis not present

## 2023-12-31 DIAGNOSIS — Z01419 Encounter for gynecological examination (general) (routine) without abnormal findings: Secondary | ICD-10-CM | POA: Diagnosis not present

## 2023-12-31 DIAGNOSIS — Z01411 Encounter for gynecological examination (general) (routine) with abnormal findings: Secondary | ICD-10-CM | POA: Diagnosis not present

## 2023-12-31 DIAGNOSIS — Z113 Encounter for screening for infections with a predominantly sexual mode of transmission: Secondary | ICD-10-CM | POA: Diagnosis not present

## 2023-12-31 DIAGNOSIS — Z124 Encounter for screening for malignant neoplasm of cervix: Secondary | ICD-10-CM | POA: Diagnosis not present

## 2023-12-31 DIAGNOSIS — E039 Hypothyroidism, unspecified: Secondary | ICD-10-CM | POA: Diagnosis not present

## 2024-01-13 DIAGNOSIS — Z1212 Encounter for screening for malignant neoplasm of rectum: Secondary | ICD-10-CM | POA: Diagnosis not present

## 2024-01-13 DIAGNOSIS — Z1211 Encounter for screening for malignant neoplasm of colon: Secondary | ICD-10-CM | POA: Diagnosis not present

## 2024-01-30 ENCOUNTER — Other Ambulatory Visit (HOSPITAL_BASED_OUTPATIENT_CLINIC_OR_DEPARTMENT_OTHER): Payer: Self-pay

## 2024-01-30 MED ORDER — LEVOTHYROXINE SODIUM 75 MCG PO TABS
75.0000 ug | ORAL_TABLET | Freq: Every day | ORAL | 3 refills | Status: AC
  Filled 2024-01-30: qty 90, 90d supply, fill #0
  Filled 2024-05-03: qty 90, 90d supply, fill #1
  Filled 2024-08-01: qty 90, 90d supply, fill #2

## 2024-02-24 DIAGNOSIS — H52223 Regular astigmatism, bilateral: Secondary | ICD-10-CM | POA: Diagnosis not present

## 2024-02-24 DIAGNOSIS — H524 Presbyopia: Secondary | ICD-10-CM | POA: Diagnosis not present

## 2024-02-24 DIAGNOSIS — H5213 Myopia, bilateral: Secondary | ICD-10-CM | POA: Diagnosis not present

## 2024-05-03 ENCOUNTER — Other Ambulatory Visit: Payer: Self-pay

## 2024-05-03 ENCOUNTER — Other Ambulatory Visit (HOSPITAL_BASED_OUTPATIENT_CLINIC_OR_DEPARTMENT_OTHER): Payer: Self-pay

## 2024-08-02 ENCOUNTER — Other Ambulatory Visit (HOSPITAL_BASED_OUTPATIENT_CLINIC_OR_DEPARTMENT_OTHER): Payer: Self-pay

## 2024-08-02 ENCOUNTER — Other Ambulatory Visit: Payer: Self-pay
